# Patient Record
Sex: Female | Born: 1950
Health system: Southern US, Community
[De-identification: ages and names within clinical notes are randomized; demographics above are authoritative.]

## PROBLEM LIST (undated history)

## (undated) DIAGNOSIS — E119 Type 2 diabetes mellitus without complications: Secondary | ICD-10-CM

## (undated) DIAGNOSIS — I10 Essential (primary) hypertension: Secondary | ICD-10-CM

## (undated) DIAGNOSIS — K922 Gastrointestinal hemorrhage, unspecified: Secondary | ICD-10-CM

## (undated) HISTORY — PX: NO PAST SURGERIES: SHX2092

## (undated) HISTORY — DX: Essential (primary) hypertension: I10

## (undated) HISTORY — PX: BUNIONECTOMY: SHX129

## (undated) HISTORY — PX: UNILATERAL SALPINGECTOMY: SHX6160
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
## 2019-01-23 ENCOUNTER — Encounter: Payer: Self-pay | Admitting: Emergency Medicine

## 2019-01-23 ENCOUNTER — Other Ambulatory Visit: Payer: Self-pay

## 2019-01-23 ENCOUNTER — Inpatient Hospital Stay
Admission: EM | Admit: 2019-01-23 | Discharge: 2019-01-24 | DRG: 378 | Disposition: A | Discharge status: Home / Self Care | Payer: Medicare Other | Attending: Internal Medicine | Admitting: Internal Medicine

## 2019-01-23 DIAGNOSIS — K922 Gastrointestinal hemorrhage, unspecified: Secondary | ICD-10-CM | POA: Diagnosis present

## 2019-01-23 DIAGNOSIS — E119 Type 2 diabetes mellitus without complications: Secondary | ICD-10-CM | POA: Diagnosis present

## 2019-01-23 DIAGNOSIS — I10 Essential (primary) hypertension: Secondary | ICD-10-CM | POA: Diagnosis present

## 2019-01-23 DIAGNOSIS — D649 Anemia, unspecified: Secondary | ICD-10-CM

## 2019-01-23 DIAGNOSIS — R300 Dysuria: Secondary | ICD-10-CM | POA: Diagnosis present

## 2019-01-23 DIAGNOSIS — D62 Acute posthemorrhagic anemia: Secondary | ICD-10-CM | POA: Diagnosis present

## 2019-01-23 HISTORY — DX: Type 2 diabetes mellitus without complications: E11.9

## 2019-01-23 LAB — HEMOGLOBIN: Hemoglobin: 10.3 g/dL — ABNORMAL LOW (ref 12.0–15.0)

## 2019-01-23 LAB — CBC
HCT: 32.4 % — ABNORMAL LOW (ref 36.0–46.0)
HEMOGLOBIN: 10.1 g/dL — AB (ref 12.0–15.0)
MCH: 27 pg (ref 26.0–34.0)
MCHC: 31.2 g/dL (ref 30.0–36.0)
MCV: 86.6 fL (ref 80.0–100.0)
Platelets: 249 10*3/uL (ref 150–400)
RBC: 3.74 MIL/uL — ABNORMAL LOW (ref 3.87–5.11)
RDW: 12.8 % (ref 11.5–15.5)
WBC: 6.4 10*3/uL (ref 4.0–10.5)
nRBC: 0 % (ref 0.0–0.2)

## 2019-01-23 LAB — COMPREHENSIVE METABOLIC PANEL
ALT: 13 U/L (ref 0–44)
AST: 16 U/L (ref 15–41)
Albumin: 3.8 g/dL (ref 3.5–5.0)
Alkaline Phosphatase: 80 U/L (ref 38–126)
Anion gap: 6 (ref 5–15)
BUN: 9 mg/dL (ref 8–23)
CO2: 24 mmol/L (ref 22–32)
Calcium: 8.7 mg/dL — ABNORMAL LOW (ref 8.9–10.3)
Chloride: 105 mmol/L (ref 98–111)
Creatinine, Ser: 0.93 mg/dL (ref 0.44–1.00)
GFR calc Af Amer: >60 mL/min (ref >60)
GFR calc non Af Amer: >60 mL/min (ref >60)
Glucose, Bld: 369 mg/dL — ABNORMAL HIGH (ref 70–99)
Potassium: 3.7 mmol/L (ref 3.5–5.1)
Sodium: 135 mmol/L (ref 135–145)
Total Bilirubin: 0.5 mg/dL (ref 0.3–1.2)
Total Protein: 6.8 g/dL (ref 6.5–8.1)

## 2019-01-23 LAB — TYPE AND SCREEN
ABO/RH(D): B POS
ANTIBODY SCREEN: NEGATIVE

## 2019-01-23 MED ORDER — INSULIN ASPART 100 UNIT/ML ~~LOC~~ SOLN
0.0000 [IU] | Freq: Four times a day (QID) | SUBCUTANEOUS | Status: DC
  Administered 2019-01-24: 5 [IU] via SUBCUTANEOUS
  Administered 2019-01-24 (×2): 2 [IU] via SUBCUTANEOUS
  Filled 2019-01-23 (×3): qty 1

## 2019-01-23 MED ORDER — ONDANSETRON HCL 4 MG PO TABS: 4.0000 mg | ORAL_TABLET | Freq: Four times a day (QID) | ORAL | Status: DC | PRN

## 2019-01-23 MED ORDER — ACETAMINOPHEN 650 MG RE SUPP: 650.0000 mg | Freq: Four times a day (QID) | RECTAL | Status: DC | PRN

## 2019-01-23 MED ORDER — HYDRALAZINE HCL 20 MG/ML IJ SOLN
INTRAMUSCULAR | Status: AC
  Administered 2019-01-23: 10 mg via INTRAVENOUS
  Filled 2019-01-23: qty 1

## 2019-01-23 MED ORDER — ACETAMINOPHEN 325 MG PO TABS: 650.0000 mg | ORAL_TABLET | Freq: Four times a day (QID) | ORAL | Status: DC | PRN

## 2019-01-23 MED ORDER — ONDANSETRON HCL 4 MG/2ML IJ SOLN: 4.0000 mg | Freq: Four times a day (QID) | INTRAMUSCULAR | Status: DC | PRN

## 2019-01-23 MED ORDER — HYDRALAZINE HCL 20 MG/ML IJ SOLN
10.0000 mg | INTRAMUSCULAR | Status: DC | PRN
  Administered 2019-01-23: 10 mg via INTRAVENOUS

## 2019-01-23 NOTE — ED Provider Notes (Signed)
[facility] Emergency Department Provider Note  ____________________________________________   First MD Initiated Contact with Patient 01/23/19 2030     (approximate)  I have reviewed the triage vital signs and the nursing notes.   HISTORY  Chief Complaint GI Bleeding   HPI Jessica [name] is a 68 y.o. female with a history of diabetes as well as past history of GI bleeding who is presented to the emergency department GI bleeding on and off over the past 3 to 4 days.  He says that her stool can be maroon to black.  Says that she has become weak as of today without any focal weakness.  States that she is not having any pain with urination or any abdominal pain.  States that she has no history of anemia.  However, she does state that she had a blood transfusion when she was 68 years old due to heavy menstrual periods.  Says that she has had GI bleeding in the past but is never received a diagnosis.  Denies taking any blood thinners.   Past Medical History:  Diagnosis Date  . Diabetes mellitus without complication ([place])     There are no active problems to display for this patient.   History reviewed. No pertinent surgical history.  Prior to Admission medications   Not on File    Allergies Patient has no known allergies.  No family history on file.  Social History Social History   Tobacco Use  . Smoking status: Never Smoker  . Smokeless tobacco: Never Used  Substance Use Topics  . Alcohol use: Not Currently  . Drug use: Never    Review of Systems  Constitutional: No fever/chills Eyes: No visual changes. ENT: No sore throat. Cardiovascular: Denies chest pain. Respiratory: Denies shortness of breath. Gastrointestinal: No abdominal pain.  No nausea, no vomiting.  No diarrhea.  No constipation. Genitourinary: Negative for dysuria. Musculoskeletal: Negative for back pain. Skin: Negative for rash. Neurological: Negative for headaches, focal  weakness or numbness.   ____________________________________________   PHYSICAL EXAM:  VITAL SIGNS: ED Triage Vitals [01/23/19 1724]  Enc Vitals Group     BP (!) 153/89     Pulse Rate 88     Resp 16     Temp 98.4 F (36.9 C)     Temp Source Oral     SpO2 99 %     Weight      Height      Head Circumference      Peak Flow      Pain Score 0     Pain Loc      Pain Edu?      Excl. in [place]?     Constitutional: Alert and oriented. Well appearing and in no acute distress. Eyes: Conjunctivae are normal.  Head: Atraumatic. Nose: No congestion/rhinnorhea. Mouth/Throat: Mucous membranes are moist.  Neck: No stridor.   Cardiovascular: Normal rate, regular rhythm. Grossly normal heart sounds.   Respiratory: Normal respiratory effort.  No retractions. Lungs CTAB. Gastrointestinal: Soft and nontender. No distention.  Digital rectal exam with hematochezia. Musculoskeletal: No lower extremity tenderness nor edema.  No joint effusions. Neurologic:  Normal speech and language. No gross focal neurologic deficits are appreciated. Skin:  Skin is warm, dry and intact. No rash noted. Psychiatric: Mood and affect are normal. Speech and behavior are normal.  ____________________________________________   LABS (all labs ordered are listed, but only abnormal results are displayed)  Labs Reviewed  COMPREHENSIVE METABOLIC PANEL - Abnormal; Notable  for the following components:      Result Value   Glucose, Bld 369 (*)    Calcium 8.7 (*)    All other components within normal limits  CBC - Abnormal; Notable for the following components:   RBC 3.74 (*)    Hemoglobin 10.1 (*)    HCT 32.4 (*)    All other components within normal limits  POC OCCULT BLOOD, ED  TYPE AND SCREEN   ____________________________________________  EKG   ____________________________________________  RADIOLOGY   ____________________________________________   PROCEDURES  Procedure(s) performed:    Procedures  Critical Care performed:   ____________________________________________   INITIAL IMPRESSION / ASSESSMENT AND PLAN / ED COURSE  Pertinent labs & imaging results that were available during my care of the patient were reviewed by me and considered in my medical decision making (see chart for details).  DDX: Diverticular bleed, internal hemorrhoid, anemia, symptomatic anemia, electrolyte abnormality As part of my medical decision making, I reviewed the following data within the [facility] previous files on record for review  ----------------------------------------- 9:24 PM on 01/23/2019 -----------------------------------------  Patient at this time is stable vital signs spoke with symptomatic anemia from GI bleeding.  Patient agrees to be admitted to the hospital.  Signed out to Dr. Jannifer Franklin.  Not at the point now we would transfuse.  ____________________________________________   FINAL CLINICAL IMPRESSION(S) / ED DIAGNOSES  Symptomatic anemia.  GI bleeding.  NEW MEDICATIONS STARTED DURING THIS VISIT:  New Prescriptions   No medications on file     Note:  This document was prepared using Dragon voice recognition software and may include unintentional dictation errors.     Orbie Pyo, MD 01/23/19 2125

## 2019-01-23 NOTE — H&P (Addendum)
[facility] NAME: Jessica [name]    MR#:  [id number]  DATE OF BIRTH:  April 15, 1951  DATE OF ADMISSION:  01/23/2019  PRIMARY CARE PHYSICIAN: System, Pcp Not In   REQUESTING/REFERRING PHYSICIAN: Clearnce Hasten, MD  CHIEF COMPLAINT:   Chief Complaint  Patient presents with  . GI Bleeding    HISTORY OF PRESENT ILLNESS:  Jessica [name]  is a 68 y.o. female who presents with chief complaint as above.  Patient presents with several days of recurrent bleeding per rectum.  She states that this started about 4 days ago, and was worse initially.  Has been improving some since then.  However, given the fact that it was not stopping she came to the ED for evaluation.  She was found to have hematochezia here.  Her hemoglobin is stable around 10, including on repeat check.  We do not have prior records of what her baseline hemoglobin is.  Hospitalist were called for admission and further evaluation  Of note, patient adamantly states she has a home closing scheduled for Friday and needs to be discharged prior to this in order to finish preparations for the same.  PAST MEDICAL HISTORY:   Past Medical History:  Diagnosis Date  . Diabetes mellitus without complication ([place])      PAST SURGICAL HISTORY:   Past Surgical History:  Procedure Laterality Date  . NO PAST SURGERIES       SOCIAL HISTORY:   Social History   Tobacco Use  . Smoking status: Never Smoker  . Smokeless tobacco: Never Used  Substance Use Topics  . Alcohol use: Not Currently     FAMILY HISTORY:    Family history reviewed and is non-contributory DRUG ALLERGIES:  No Known Allergies  MEDICATIONS AT HOME:   Prior to Admission medications   Not on File    REVIEW OF SYSTEMS:  Review of Systems  Constitutional: Positive for malaise/fatigue. Negative for chills, fever and weight loss.  HENT: Negative for ear pain, hearing loss and tinnitus.   Eyes: Negative for  blurred vision, double vision, pain and redness.  Respiratory: Negative for cough, hemoptysis and shortness of breath.   Cardiovascular: Negative for chest pain, palpitations, orthopnea and leg swelling.  Gastrointestinal: Positive for blood in stool. Negative for abdominal pain, constipation, diarrhea, nausea and vomiting.  Genitourinary: Negative for dysuria, frequency and hematuria.  Musculoskeletal: Negative for back pain, joint pain and neck pain.  Skin:       No acne, rash, or lesions  Neurological: Negative for dizziness, tremors, focal weakness and weakness.  Endo/Heme/Allergies: Negative for polydipsia. Does not bruise/bleed easily.  Psychiatric/Behavioral: Negative for depression. The patient is not nervous/anxious and does not have insomnia.      VITAL SIGNS:   Vitals:   01/23/19 1724  BP: (!) 153/89  Pulse: 88  Resp: 16  Temp: 98.4 F (36.9 C)  TempSrc: Oral  SpO2: 99%   Wt Readings from Last 3 Encounters:  No data found for Wt    PHYSICAL EXAMINATION:  Physical Exam  Vitals reviewed. Constitutional: She is oriented to person, place, and time. She appears well-developed and well-nourished. No distress.  HENT:  Head: Normocephalic and atraumatic.  Mouth/Throat: Oropharynx is clear and moist.  Eyes: Pupils are equal, round, and reactive to light. Conjunctivae and EOM are normal. No scleral icterus.  Neck: Normal range of motion. Neck supple. No JVD present. No thyromegaly present.  Cardiovascular: Normal rate, regular rhythm and intact  distal pulses. Exam reveals no gallop and no friction rub.  No murmur heard. Respiratory: Effort normal and breath sounds normal. No respiratory distress. She has no wheezes. She has no rales.  GI: Soft. Bowel sounds are normal. She exhibits no distension. There is no abdominal tenderness.  Musculoskeletal: Normal range of motion.        General: No edema.     Comments: No arthritis, no gout  Lymphadenopathy:    She has no  cervical adenopathy.  Neurological: She is alert and oriented to person, place, and time. No cranial nerve deficit.  No dysarthria, no aphasia  Skin: Skin is warm and dry. No rash noted. No erythema.  Psychiatric: She has a normal mood and affect. Her behavior is normal. Judgment and thought content normal.    LABORATORY PANEL:   CBC Recent Labs  Lab 01/23/19 1727  WBC 6.4  HGB 10.1*  HCT 32.4*  PLT 249   ------------------------------------------------------------------------------------------------------------------  Chemistries  Recent Labs  Lab 01/23/19 1727  NA 135  K 3.7  CL 105  CO2 24  GLUCOSE 369*  BUN 9  CREATININE 0.93  CALCIUM 8.7*  AST 16  ALT 13  ALKPHOS 80  BILITOT 0.5   ------------------------------------------------------------------------------------------------------------------  Cardiac Enzymes No results for input(s): TROPONINI in the last 168 hours. ------------------------------------------------------------------------------------------------------------------  RADIOLOGY:  No results found.  EKG:  No orders found for this or any previous visit.  IMPRESSION AND PLAN:  Principal Problem:   GI bleed -keep n.p.o., check serial hemoglobin, GI consult Active Problems:   Diabetes (HCC) -sliding scale insulin coverage  Chart review performed and case discussed with ED provider. Labs, imaging and/or ECG reviewed by provider and discussed with patient/family. Management plans discussed with the patient and/or family.  DVT PROPHYLAXIS: Mechanical only  GI PROPHYLAXIS:  None  ADMISSION STATUS: Inpatient     CODE STATUS: Full  TOTAL TIME TAKING CARE OF THIS PATIENT: 45 minutes.   Ethlyn Daniels 01/23/2019, 9:58 PM  [facility]  Office  [phone]  CC: Primary care physician; System, Pcp Not In  Note:  This document was prepared using Dragon voice recognition software and may include unintentional dictation  errors.

## 2019-01-23 NOTE — ED Triage Notes (Signed)
PT c/o bloody, dark stool x4days. Pt states bleeding is getting better. Pt states increased weakness. VSS Denies any anticoag use

## 2019-01-24 LAB — BASIC METABOLIC PANEL
ANION GAP: 4 — AB (ref 5–15)
BUN: 9 mg/dL (ref 8–23)
CO2: 24 mmol/L (ref 22–32)
Calcium: 9.1 mg/dL (ref 8.9–10.3)
Chloride: 112 mmol/L — ABNORMAL HIGH (ref 98–111)
Creatinine, Ser: 0.75 mg/dL (ref 0.44–1.00)
GFR calc Af Amer: >60 mL/min (ref >60)
GFR calc non Af Amer: >60 mL/min (ref >60)
Glucose, Bld: 205 mg/dL — ABNORMAL HIGH (ref 70–99)
Potassium: 3.9 mmol/L (ref 3.5–5.1)
Sodium: 140 mmol/L (ref 135–145)

## 2019-01-24 LAB — CBC
HCT: 30.9 % — ABNORMAL LOW (ref 36.0–46.0)
Hemoglobin: 9.9 g/dL — ABNORMAL LOW (ref 12.0–15.0)
MCH: 27 pg (ref 26.0–34.0)
MCHC: 32 g/dL (ref 30.0–36.0)
MCV: 84.4 fL (ref 80.0–100.0)
Platelets: 248 10*3/uL (ref 150–400)
RBC: 3.66 MIL/uL — ABNORMAL LOW (ref 3.87–5.11)
RDW: 12.8 % (ref 11.5–15.5)
WBC: 6.2 10*3/uL (ref 4.0–10.5)
nRBC: 0 % (ref 0.0–0.2)

## 2019-01-24 LAB — GLUCOSE, CAPILLARY
GLUCOSE-CAPILLARY: 189 mg/dL — AB (ref 70–99)
Glucose-Capillary: 174 mg/dL — ABNORMAL HIGH (ref 70–99)
Glucose-Capillary: 296 mg/dL — ABNORMAL HIGH (ref 70–99)

## 2019-01-24 MED ORDER — HYDRALAZINE HCL 25 MG PO TABS: 25.0000 mg | ORAL_TABLET | Freq: Three times a day (TID) | ORAL | 0 refills | Status: DC

## 2019-01-24 MED ORDER — NYSTATIN-TRIAMCINOLONE 100000-0.1 UNIT/GM-% EX OINT: 1.0000 "application " | TOPICAL_OINTMENT | Freq: Two times a day (BID) | CUTANEOUS | 0 refills | Status: DC

## 2019-01-24 NOTE — Progress Notes (Signed)
[facility] [place] NAME: Jessica [name]    MR#:  [id number]  DATE OF BIRTH:  10/11/1951  SUBJECTIVE: Patient admitted for rectal bleed, patient told me that she has bleeding from stool for last 3 days and also has dysuria.  More tremulous recently, in the process of buying a home [facility] has appointment with attorney today at 4 PM for house closing.  Patient denies abdominal pain,   CHIEF COMPLAINT:   Chief Complaint  Patient presents with  . GI Bleeding    REVIEW OF SYSTEMS:   ROS CONSTITUTIONAL: No fever, fatigue or weakness.  EYES: No blurred or double vision.  EARS, NOSE, AND THROAT: No tinnitus or ear pain.  RESPIRATORY: No cough, shortness of breath, wheezing or hemoptysis.  CARDIOVASCULAR: No chest pain, orthopnea, edema.  GASTROINTESTINAL: No nausea, vomiting, diarrhea or abdominal pain.  GENITOURINARY: Complains of dysuria but no hematuria ENDOCRINE: No polyuria, nocturia,  HEMATOLOGY: No anemia, easy bruising or bleeding SKIN: No rash or lesion. MUSCULOSKELETAL: No joint pain or arthritis.   NEUROLOGIC: No tingling, numbness, weakness.  PSYCHIATRY: No anxiety or depression.   DRUG ALLERGIES:  No Known Allergies  VITALS:  Blood pressure (!) 142/72, pulse 94, temperature 98.8 F (37.1 C), temperature source Oral, resp. rate 20, height 5\' 9"  (1.753 m), weight 87.1 kg, SpO2 100 %.  PHYSICAL EXAMINATION:  GENERAL:  68 y.o.-year-old patient lying in the bed with no acute distress.  EYES: Pupils equal, round, reactive to light and accommodation. No scleral icterus. Extraocular muscles intact.  HEENT: Head atraumatic, normocephalic. Oropharynx and nasopharynx clear.  NECK:  Supple, no jugular venous distention. No thyroid enlargement, no tenderness.  LUNGS: Normal breath sounds bilaterally, no wheezing, rales,rhonchi or crepitation. No use of accessory muscles of respiration.  CARDIOVASCULAR: S1, S2 normal. No murmurs,  rubs, or gallops.  ABDOMEN: Soft, nontender, nondistended. Bowel sounds present. No organomegaly or mass.  EXTREMITIES: No pedal edema, cyanosis, or clubbing.  NEUROLOGIC: Cranial nerves II through XII are intact. Muscle strength 5/5 in all extremities. Sensation intact. Gait not checked.  PSYCHIATRIC: The patient is alert and oriented x 3.  SKIN: No obvious rash, lesion, or ulcer.    LABORATORY PANEL:   CBC Recent Labs  Lab 01/24/19 0432  WBC 6.2  HGB 9.9*  HCT 30.9*  PLT 248   ------------------------------------------------------------------------------------------------------------------  Chemistries  Recent Labs  Lab 01/23/19 1727 01/24/19 0432  NA 135 140  K 3.7 3.9  CL 105 112*  CO2 24 24  GLUCOSE 369* 205*  BUN 9 9  CREATININE 0.93 0.75  CALCIUM 8.7* 9.1  AST 16  --   ALT 13  --   ALKPHOS 80  --   BILITOT 0.5  --    ------------------------------------------------------------------------------------------------------------------  Cardiac Enzymes No results for input(s): TROPONINI in the last 168 hours. ------------------------------------------------------------------------------------------------------------------  RADIOLOGY:  No results found.  EKG:  No orders found for this or any previous visit.  ASSESSMENT AND PLAN:   68 year old female patient with history of diabetes mellitus type 2, who recently moved from [place] and has no PCP here comes in with rectal bleeding going on for last 3 days, also has dysuria. 1.  Rectal bleed likely lower GI, hemoglobin stable, hemoglobin was 10.1 yesterday and 9.9 today.  Spoke with Dr. Alice Reichert gastroenterology on call, recommended clear liquid diet, possible EGD, colonoscopy on this admission, same explained to the patient, patient does not seem to be worried about rectal bleeding anymore-she  is more focused on had appointment with her attorney for house closure, recently bought a house in [place], moved from  [place].   #2. dysuria, check UA, start antibiotics if there is infection.  Patient thinks she has yeast infection.  Give 1 dose of Diflucan. Diabetes mellitus type 2: Continue sliding scale insulin with coverage by the hospital.   All the records are reviewed and case discussed with Care Management/Social Workerr. Management plans discussed with the patient, family and they are in agreement.  CODE STATUS: Full code  TOTAL TIME TAKING CARE OF THIS PATIENT: 35 minutes.   Possible discharge tomorrow.Epifanio Lesches M.D on 01/24/2019 at 11:34 AM  Between 7am to 6pm - Pager - [phone]  After 6pm go to [email or web address] - password [email or web address] [facility]  Office  [phone]  CC: Primary care physician; System, Pcp Not In   Note: This dictation was prepared with Dragon dictation along with smaller phrase technology. Any transcriptional errors that result from this process are unintentional.

## 2019-01-24 NOTE — Consult Note (Signed)
GI Inpatient Consult Note  Reason for Consult: Rectal bleeding, symptomatic anemia   Attending Requesting Consult: Dr. Vianne Bulls  History of Present Illness: Jessica [name] is a 68 y.o. female seen for evaluation of rectal bleeding at the request of Dr. Vianne Bulls. PMH is significant for diabetes mellitus. Pt presented to the ED last night for rectal bleeding for the past four days which seemed to be improving somewhat but was concerned because the bleeding persisted. She was found to have hematochezia on digital exam in the ED and hemoglobin 10.1. No baseline hemoglobin to compare to. Hemoglobin today 9.9.  Patient seen and examined this afternoon resting comfortably in bed. She reports she has had BM this morning which was not bloody. Her main complaint today is dysuria w/ associated urgency and burning when urinating. She denies hematuria. She denies abdominal pain or cramping preceding bowel movements. She denies dark, tarry stools. She was feeling very tired yesterday, but feels better today. She reports a previously colonoscopy in [place] appx 3-4 years ago which was reportedly negative. She denies any known family hx of colon cancer or adenomatous polyps. No unintentional weight loss. She is very concerned about her upcoming appt to close on her new house in [place]. She is moving here from [place]. She denies frequent NSAID use.    Last Colonoscopy: 4 years ago?  Report not available  Last Endoscopy:    Past Medical History:  Past Medical History:  Diagnosis Date  . Diabetes mellitus without complication [facility])     Problem List: Patient Active Problem List   Diagnosis Date Noted  . GI bleed 01/23/2019  . Diabetes ([place]) 01/23/2019    Past Surgical History: Past Surgical History:  Procedure Laterality Date  . NO PAST SURGERIES      Allergies: No Known Allergies  Home Medications: No medications prior to admission.   Home medication reconciliation was completed with the  patient.   Scheduled Inpatient Medications:   . insulin aspart  0-9 Units Subcutaneous Q6H    Continuous Inpatient Infusions:    PRN Inpatient Medications:  acetaminophen **OR** acetaminophen, hydrALAZINE, ondansetron **OR** ondansetron (ZOFRAN) IV  Family History: family history is not on file.  The patient's family history is negative for inflammatory bowel disorders, GI malignancy, or solid organ transplantation.  Social History:   reports that she has never smoked. She has never used smokeless tobacco. She reports previous alcohol use. She reports that she does not use drugs. The patient denies ETOH, tobacco, or drug use.   Review of Systems: Constitutional: Weight is stable.  Eyes: No changes in vision. ENT: No oral lesions, sore throat.  GI: see HPI.  Heme/Lymph: No easy bruising.  CV: No chest pain.  GU: No hematuria.  Integumentary: No rashes.  Neuro: No headaches.  Psych: No depression/anxiety.  Endocrine: No heat/cold intolerance.  Allergic/Immunologic: No urticaria.  Resp: No cough, SOB.  Musculoskeletal: No joint swelling.    Physical Examination: BP (!) 142/72   Pulse 94   Temp 98.8 F (37.1 C) (Oral)   Resp 20   Ht 5\' 9"  (1.753 m)   Wt 87.1 kg   SpO2 100%   BMI 28.35 kg/m  Gen: NAD, alert and oriented x 4 HEENT: PEERLA, EOMI, Neck: supple, no JVD or thyromegaly Chest: CTA bilaterally, no wheezes, crackles, or other adventitious sounds CV: RRR, no m/g/c/r Abd: soft, NT, ND, +BS in all four quadrants; no HSM, guarding, ridigity, or rebound tenderness Ext: no edema, well perfused with 2+  pulses, Skin: no rash or lesions noted Lymph: no LAD  Data: Lab Results  Component Value Date   WBC 6.2 01/24/2019   HGB 9.9 (L) 01/24/2019   HCT 30.9 (L) 01/24/2019   MCV 84.4 01/24/2019   PLT 248 01/24/2019   Recent Labs  Lab 01/23/19 1727 01/23/19 2320 01/24/19 0432  HGB 10.1* 10.3* 9.9*   Lab Results  Component Value Date   NA 140 01/24/2019    K 3.9 01/24/2019   CL 112 (H) 01/24/2019   CO2 24 01/24/2019   BUN 9 01/24/2019   CREATININE 0.75 01/24/2019   Lab Results  Component Value Date   ALT 13 01/23/2019   AST 16 01/23/2019   ALKPHOS 80 01/23/2019   BILITOT 0.5 01/23/2019   No results for input(s): APTT, INR, PTT in the last 168 hours. Assessment/Plan:  68 y/o AA female with a PMH of diabetes admitted for rectal bleeding  1. Rectal bleeding 2. Symptomatic anemia   - Pt has noticed bright red blood in her stools for the past 4 days. BM today w/o any hematochezia or melena. She does have symptomatic anemia but she is currently hemodynamically stable. No baseline hemoglobin to compare to. - Differential includes anal outlet etiology such as internal hemorrhoids, diverticular, polyp, malignancy, AVM - Recommend luminal evaluation with colonoscopy for definitive evaluation. We had extensive conversation today about the procedure details and indications. She would like hold off at this time due to her upcoming closing on her house. She would like to f/u in the office to schedule procedures. We discussed should she have any recurrent bleeding or other symptoms like nausea, vomiting, shortness of breath, or dizziness, she should return to ED immediately. - She is advised to call our office to schedule a f/u appt with myself or Dr. Alice Reichert   Thank you for the consult. Please call with questions or concerns.  Geanie Kenning, PA-C [facility]  [phone]

## 2019-01-24 NOTE — Progress Notes (Signed)
Inpatient Diabetes Program Recommendations  AACE/ADA: New Consensus Statement on Inpatient Glycemic Control (2015)  Target Ranges:  Prepandial:   less than 140 mg/dL      Peak postprandial:   less than 180 mg/dL (1-2 hours)      Critically ill patients:  140 - 180 mg/dL   Lab Results  Component Value Date   GLUCAP 174 (H) 01/24/2019    Review of Glycemic ControlResults for Jessica, [name] (MRN [id number]) as of 01/24/2019 12:52  Ref. Range 01/24/2019 00:00 01/24/2019 06:12 01/24/2019 11:51  Glucose-Capillary Latest Ref Range: 70 - 99 mg/dL 296 (H) 189 (H) 174 (H)    Diabetes history: DM Outpatient Diabetes medications: None Current orders for Inpatient glycemic control:  Novolog sensitive q 6 hours  Inpatient Diabetes Program Recommendations:   Blood sugars improved however >300 mg/dL initially. No home DM medications listed. Will need to f/u with PCP regarding elevated blood sugars.    Thanks,  Adah Perl, RN, BC-ADM Inpatient Diabetes Coordinator Pager [phone] (8a-5p)

## 2019-01-24 NOTE — Progress Notes (Signed)
Order received from Dr Vianne Bulls to discharge the patient.  IV removed.  Patient being sent out via wheechair to her car and is driving herself home

## 2019-01-25 LAB — HIV ANTIBODY (ROUTINE TESTING W REFLEX): HIV Screen 4th Generation wRfx: NONREACTIVE

## 2019-01-29 NOTE — Discharge Summary (Signed)
Jessica [name], is a 68 y.o. female  DOB [birth year]  MRN [id number].  Admission date:  01/23/2019  Admitting Physician  Lance Coon, MD  Discharge Date:  01/24/2019   Primary MD  System, Pcp Not In  Recommendations for primary care physician for things to follow:      Admission Diagnosis  Symptomatic anemia [D64.9] Gastrointestinal hemorrhage, unspecified gastrointestinal hemorrhage type [K92.2]   Discharge Diagnosis  Symptomatic anemia [D64.9] Gastrointestinal hemorrhage, unspecified gastrointestinal hemorrhage type [K92.2]    Principal Problem:   GI bleed Active Problems:   Diabetes [facility])      Past Medical History:  Diagnosis Date  . Diabetes mellitus without complication [facility])     Past Surgical History:  Procedure Laterality Date  . NO PAST SURGERIES         History of present illness and  Hospital Course:     Kindly see H&P for history of present illness and admission details, please review complete Labs, Consult reports and Test reports for all details in brief  HPI  from the history and physical done on the day of admission  68 year old female patient with history of diabetes mellitus type 2 comes in for follow-up blood from stool and admitted for possible GI bleed.  Hospital Course  #1 rectal bleed: Admitted to medical service for this, monitor hemoglobin, started on IV fluids, n.p.o., no gastroenterology.  Noticed blood in the stool for 4 days.  Patient remained hemodynamically stable with stable hemoglobin Of 10.1 and 10.3.  Seen by gastroenterology, offered colonoscopy but she refused, patient did not have any further hematochezia or melena.  she wanted to go home recently moved from [place] and in the process of buying a home in [place].  Advised to follow-up in the office, also advised  her to come back to emergency room.  Experiences recurrent bleeding, nausea, vomiting, shortness of breath. #2 diabetes mellitus type 2: Patient is on Trulicity, metformin, NovoLog with meals.  Advised to continue, advised her to follow have a new PCP here. #3 dysuria: Requested antifungal medicine for possible vaginitis due to Candida, patient tested she feels itchy and she thinks it is yeast infection.  Give prescription for nystatin ointment. 4.  Essential hypertension-new, received hydralazine prescription.  Discharge Condition: stable   Follow UP      Discharge Instructions  and  Discharge Medications     Allergies as of 01/24/2019   No Known Allergies     Medication List    TAKE these medications   hydrALAZINE 25 MG tablet Commonly known as:  APRESOLINE Take 1 tablet (25 mg total) by mouth 3 (three) times daily.   insulin aspart 100 UNIT/ML injection Commonly known as:  novoLOG Inject 8 Units into the skin 3 (three) times daily before meals.   metFORMIN 500 MG tablet Commonly known as:  GLUCOPHAGE Take 1,000 mg by mouth 2 (two) times daily with a meal.   nystatin-triamcinolone ointment Commonly known as:  MYCOLOG Apply 1 application topically 2 (two) times daily. For vaginal candidiasis   TRULICITY 1.5 QQ/7.6PP Sopn Generic drug:  Dulaglutide Inject 1 pen into the skin every 7 (seven) days.         Diet and Activity recommendation: See Discharge Instructions above   Consults obtained - Gastroeneterologist   Major procedures and Radiology Reports - PLEASE review detailed and final reports for all details, in brief -       No results found.  Micro Results    No  results found for this or any previous visit (from the past 240 hour(s)).     Today   Subjective:   Dangela How today has no headache,no chest abdominal pain,no new weakness tingling or numbness, feels much better wants to go home today.   Objective:   Blood pressure (!) 147/81,  pulse 90, temperature 99.1 F (37.3 C), temperature source Oral, resp. rate 16, height 5\' 9"  (1.753 m), weight 87.1 kg, SpO2 97 %.  No intake or output data in the 24 hours ending 01/29/19 0954  Exam Awake Alert, Oriented x 3, No new F.N deficits, Normal affect [place].AT,PERRAL Supple Neck,No JVD, No cervical lymphadenopathy appriciated.  Symmetrical Chest wall movement, Good air movement bilaterally, CTAB RRR,No Gallops,Rubs or new Murmurs, No Parasternal Heave +ve B.Sounds, Abd Soft, Non tender, No organomegaly appriciated, No rebound -guarding or rigidity. No Cyanosis, Clubbing or edema, No new Rash or bruise  Data Review   CBC w Diff:  Lab Results  Component Value Date   WBC 6.2 01/24/2019   HGB 9.9 (L) 01/24/2019   HCT 30.9 (L) 01/24/2019   PLT 248 01/24/2019    CMP:  Lab Results  Component Value Date   NA 140 01/24/2019   K 3.9 01/24/2019   CL 112 (H) 01/24/2019   CO2 24 01/24/2019   BUN 9 01/24/2019   CREATININE 0.75 01/24/2019   PROT 6.8 01/23/2019   ALBUMIN 3.8 01/23/2019   BILITOT 0.5 01/23/2019   ALKPHOS 80 01/23/2019   AST 16 01/23/2019   ALT 13 01/23/2019  .   Total Time in preparing paper work, data evaluation and todays exam - 35 minutes  Epifanio Lesches M.D on 2/052020 at 9:54 AM    Note: This dictation was prepared with Dragon dictation along with smaller phrase technology. Any transcriptional errors that result from this process are unintentional.

## 2019-02-06 ENCOUNTER — Ambulatory Visit: Payer: Self-pay | Admitting: Family Medicine

## 2019-08-08 ENCOUNTER — Emergency Department
Admission: EM | Admit: 2019-08-08 | Discharge: 2019-08-08 | Disposition: A | Discharge status: Home / Self Care | Payer: Medicare Other | Attending: Emergency Medicine | Admitting: Emergency Medicine

## 2019-08-08 ENCOUNTER — Emergency Department: Payer: Medicare Other

## 2019-08-08 ENCOUNTER — Other Ambulatory Visit: Payer: Self-pay

## 2019-08-08 DIAGNOSIS — R0600 Dyspnea, unspecified: Secondary | ICD-10-CM | POA: Insufficient documentation

## 2019-08-08 DIAGNOSIS — E1165 Type 2 diabetes mellitus with hyperglycemia: Secondary | ICD-10-CM | POA: Diagnosis not present

## 2019-08-08 DIAGNOSIS — R0602 Shortness of breath: Secondary | ICD-10-CM | POA: Diagnosis present

## 2019-08-08 DIAGNOSIS — R739 Hyperglycemia, unspecified: Secondary | ICD-10-CM

## 2019-08-08 LAB — CBC WITH DIFFERENTIAL/PLATELET
Abs Immature Granulocytes: 0.01 10*3/uL (ref 0.00–0.07)
Basophils Absolute: 0 10*3/uL (ref 0.0–0.1)
Basophils Relative: 1 %
Eosinophils Absolute: 0.2 10*3/uL (ref 0.0–0.5)
Eosinophils Relative: 3 %
HCT: 39.4 % (ref 36.0–46.0)
Hemoglobin: 12.7 g/dL (ref 12.0–15.0)
Immature Granulocytes: 0 %
Lymphocytes Relative: 40 %
Lymphs Abs: 2.1 10*3/uL (ref 0.7–4.0)
MCH: 26.3 pg (ref 26.0–34.0)
MCHC: 32.2 g/dL (ref 30.0–36.0)
MCV: 81.7 fL (ref 80.0–100.0)
Monocytes Absolute: 0.4 10*3/uL (ref 0.1–1.0)
Monocytes Relative: 7 %
Neutro Abs: 2.6 10*3/uL (ref 1.7–7.7)
Neutrophils Relative %: 49 %
Platelets: 270 10*3/uL (ref 150–400)
RBC: 4.82 MIL/uL (ref 3.87–5.11)
RDW: 13.2 % (ref 11.5–15.5)
WBC: 5.3 10*3/uL (ref 4.0–10.5)
nRBC: 0 % (ref 0.0–0.2)

## 2019-08-08 LAB — BASIC METABOLIC PANEL
Anion gap: 11 (ref 5–15)
BUN: 10 mg/dL (ref 8–23)
CO2: 23 mmol/L (ref 22–32)
Calcium: 9.6 mg/dL (ref 8.9–10.3)
Chloride: 98 mmol/L (ref 98–111)
Creatinine, Ser: 0.99 mg/dL (ref 0.44–1.00)
GFR calc Af Amer: >60 mL/min (ref >60)
GFR calc non Af Amer: 59 mL/min — ABNORMAL LOW (ref >60)
Glucose, Bld: 459 mg/dL — ABNORMAL HIGH (ref 70–99)
Potassium: 3.8 mmol/L (ref 3.5–5.1)
Sodium: 132 mmol/L — ABNORMAL LOW (ref 135–145)

## 2019-08-08 LAB — TROPONIN I (HIGH SENSITIVITY): Troponin I (High Sensitivity): 7 ng/L (ref <18)

## 2019-08-08 MED ORDER — TRULICITY 1.5 MG/0.5ML ~~LOC~~ SOAJ: 1.0000 "pen " | SUBCUTANEOUS | 4 refills | Status: DC

## 2019-08-08 MED ORDER — INSULIN ASPART 100 UNIT/ML ~~LOC~~ SOLN: 8.0000 [IU] | Freq: Three times a day (TID) | SUBCUTANEOUS | 1 refills | Status: DC

## 2019-08-08 MED ORDER — NYSTATIN-TRIAMCINOLONE 100000-0.1 UNIT/GM-% EX OINT: 1.0000 "application " | TOPICAL_OINTMENT | Freq: Two times a day (BID) | CUTANEOUS | 0 refills | Status: DC

## 2019-08-08 MED ORDER — METFORMIN HCL 500 MG PO TABS: 1000.0000 mg | ORAL_TABLET | Freq: Two times a day (BID) | ORAL | 1 refills | Status: DC

## 2019-08-08 MED ORDER — HYDRALAZINE HCL 25 MG PO TABS: 25.0000 mg | ORAL_TABLET | Freq: Three times a day (TID) | ORAL | 0 refills | Status: DC

## 2019-08-08 NOTE — ED Notes (Signed)
Pt sitting on bench when I entered the room in NAD, pt A&Ox4. Pt reports sister has walking pneumonia so she wanted to get checked out. She reports she has had a mild cough but no SHOB or distress.

## 2019-08-08 NOTE — ED Provider Notes (Signed)
[facility] Emergency Department Provider Note       Time seen: ----------------------------------------- 10:50 AM on 08/08/2019 -----------------------------------------   I have reviewed the triage vital signs and the nursing notes.  HISTORY   Chief Complaint Shortness of Breath    HPI Jessica [name] is a 68 y.o. female with a history of diabetes and GI bleeding who presents to the ED for shortness of breath for the past 2 days.  Patient states she feels fine at this time.  She states her sister was recently diagnosed with walking pneumonia.  She denies fevers or chills, states she occasionally has shortness of breath and chest pain.  Denies vomiting or diarrhea.  Past Medical History:  Diagnosis Date  . Diabetes mellitus without complication [facility])     Patient Active Problem List   Diagnosis Date Noted  . GI bleed 01/23/2019  . Diabetes ([place]) 01/23/2019    Past Surgical History:  Procedure Laterality Date  . NO PAST SURGERIES      Allergies Patient has no known allergies.  Social History Social History   Tobacco Use  . Smoking status: Never Smoker  . Smokeless tobacco: Never Used  Substance Use Topics  . Alcohol use: Not Currently  . Drug use: Never   Review of Systems Constitutional: Negative for fever. Cardiovascular: Positive for occasional chest pain Respiratory: Positive for shortness of breath Gastrointestinal: Negative for abdominal pain, vomiting and diarrhea. Musculoskeletal: Negative for back pain. Skin: Negative for rash. Neurological: Negative for headaches, focal weakness or numbness.  All systems negative/normal/unremarkable except as stated in the HPI  ____________________________________________   PHYSICAL EXAM:  VITAL SIGNS: ED Triage Vitals [08/08/19 1038]  Enc Vitals Group     BP (!) 117/97     Pulse Rate (!) 110     Resp 16     Temp 98.2 F (36.8 C)     Temp Source Oral     SpO2 100 %     Weight 184  lb (83.5 kg)     Height 5\' 7"  (1.702 m)     Head Circumference      Peak Flow      Pain Score 0     Pain Loc      Pain Edu?      Excl. in [place]?    Constitutional: Alert and oriented. Well appearing and in no distress. Eyes: Conjunctivae are normal. Normal extraocular movements. ENT      Head: Normocephalic and atraumatic.      Nose: No congestion/rhinnorhea.      Mouth/Throat: Mucous membranes are moist.      Neck: No stridor. Cardiovascular: Normal rate, regular rhythm. No murmurs, rubs, or gallops. Respiratory: Normal respiratory effort without tachypnea nor retractions. Breath sounds are clear and equal bilaterally. No wheezes/rales/rhonchi. Gastrointestinal: Soft and nontender. Normal bowel sounds Musculoskeletal: Nontender with normal range of motion in extremities. No lower extremity tenderness nor edema. Neurologic:  Normal speech and language. No gross focal neurologic deficits are appreciated.  Skin:  Skin is warm, dry and intact. No rash noted. Psychiatric: Mood and affect are normal. Speech and behavior are normal.  ____________________________________________  EKG: Interpreted by me.  Sinus tachycardia with a rate of 104 bpm, normal PR interval, normal QRS, normal QT  ____________________________________________  ED COURSE:  As part of my medical decision making, I reviewed the following data within the [place] History obtained from family if available, nursing notes, old chart and ekg, as well as notes  from prior ED visits. Patient presented for shortness of breath, we will assess with labs and imaging as indicated at this time.   Procedures  Jessica [name] was evaluated in Emergency Department on 08/08/2019 for the symptoms described in the history of present illness. She was evaluated in the context of the global COVID-19 pandemic, which necessitated consideration that the patient might be at risk for infection with the SARS-CoV-2 virus that causes  COVID-19. Institutional protocols and algorithms that pertain to the evaluation of patients at risk for COVID-19 are in a state of rapid change based on information released by regulatory bodies including the CDC and federal and state organizations. These policies and algorithms were followed during the patient's care in the ED.  ____________________________________________   LABS (pertinent positives/negatives)  Labs Reviewed  BASIC METABOLIC PANEL - Abnormal; Notable for the following components:      Result Value   Sodium 132 (*)    Glucose, Bld 459 (*)    GFR calc non Af Amer 59 (*)    All other components within normal limits  CBC WITH DIFFERENTIAL/PLATELET  TROPONIN I (HIGH SENSITIVITY)    RADIOLOGY Images were viewed by me  Chest x-ray IMPRESSION:  No active cardiopulmonary disease.  ____________________________________________   DIFFERENTIAL DIAGNOSIS   Anxiety, arrhythmia, MI, PE, pneumonia  FINAL ASSESSMENT AND PLAN  Dyspnea, hyperglycemia   Plan: The patient had presented for occasional dyspnea. Patient's labs did indicate significant hyperglycemia although she has no symptoms from this. Patient's imaging was unremarkable.  I will refill her medications, as it turns out she has not been taking her medications for several weeks.  She is cleared for outpatient follow-up.   Laurence Aly, MD    Note: This note was generated in part or whole with voice recognition software. Voice recognition is usually quite accurate but there are transcription errors that can and very often do occur. I apologize for any typographical errors that were not detected and corrected.     Earleen Newport, MD 08/08/19 1302

## 2019-08-08 NOTE — ED Triage Notes (Signed)
Sob X 2 days. Pt reports she feels 100% fine. Pt alert and oriented X4, cooperative, RR even and unlabored, color WNL. Pt in NAD.

## 2019-09-06 ENCOUNTER — Telehealth: Payer: Self-pay

## 2019-09-06 NOTE — Telephone Encounter (Signed)
Called patient to do their pre-visit COVID screening.  Call went to voicemail. Unable to do prescreening.  

## 2019-09-08 ENCOUNTER — Other Ambulatory Visit: Payer: Self-pay

## 2019-09-08 ENCOUNTER — Inpatient Hospital Stay
Admission: EM | Admit: 2019-09-08 | Discharge: 2019-09-12 | DRG: 378 | Disposition: A | Discharge status: Home / Self Care | Payer: Medicare Other | Attending: Internal Medicine | Admitting: Internal Medicine

## 2019-09-08 ENCOUNTER — Encounter: Payer: Self-pay | Admitting: Emergency Medicine

## 2019-09-08 ENCOUNTER — Inpatient Hospital Stay: Payer: Medicare Other

## 2019-09-08 DIAGNOSIS — K5731 Diverticulosis of large intestine without perforation or abscess with bleeding: Secondary | ICD-10-CM | POA: Diagnosis not present

## 2019-09-08 DIAGNOSIS — K625 Hemorrhage of anus and rectum: Secondary | ICD-10-CM | POA: Diagnosis not present

## 2019-09-08 DIAGNOSIS — D62 Acute posthemorrhagic anemia: Secondary | ICD-10-CM | POA: Diagnosis not present

## 2019-09-08 DIAGNOSIS — Z800 Family history of malignant neoplasm of digestive organs: Secondary | ICD-10-CM

## 2019-09-08 DIAGNOSIS — E119 Type 2 diabetes mellitus without complications: Secondary | ICD-10-CM | POA: Diagnosis present

## 2019-09-08 DIAGNOSIS — I10 Essential (primary) hypertension: Secondary | ICD-10-CM | POA: Diagnosis present

## 2019-09-08 DIAGNOSIS — Z79899 Other long term (current) drug therapy: Secondary | ICD-10-CM | POA: Diagnosis not present

## 2019-09-08 DIAGNOSIS — K5790 Diverticulosis of intestine, part unspecified, without perforation or abscess without bleeding: Secondary | ICD-10-CM | POA: Diagnosis not present

## 2019-09-08 DIAGNOSIS — K635 Polyp of colon: Secondary | ICD-10-CM | POA: Diagnosis not present

## 2019-09-08 DIAGNOSIS — D123 Benign neoplasm of transverse colon: Secondary | ICD-10-CM | POA: Diagnosis not present

## 2019-09-08 DIAGNOSIS — K921 Melena: Secondary | ICD-10-CM | POA: Diagnosis not present

## 2019-09-08 DIAGNOSIS — Z794 Long term (current) use of insulin: Secondary | ICD-10-CM | POA: Diagnosis not present

## 2019-09-08 DIAGNOSIS — Z03818 Encounter for observation for suspected exposure to other biological agents ruled out: Secondary | ICD-10-CM | POA: Diagnosis not present

## 2019-09-08 DIAGNOSIS — Z833 Family history of diabetes mellitus: Secondary | ICD-10-CM | POA: Diagnosis not present

## 2019-09-08 DIAGNOSIS — E876 Hypokalemia: Secondary | ICD-10-CM | POA: Diagnosis present

## 2019-09-08 DIAGNOSIS — K922 Gastrointestinal hemorrhage, unspecified: Secondary | ICD-10-CM

## 2019-09-08 DIAGNOSIS — K5730 Diverticulosis of large intestine without perforation or abscess without bleeding: Secondary | ICD-10-CM | POA: Diagnosis not present

## 2019-09-08 DIAGNOSIS — Z20828 Contact with and (suspected) exposure to other viral communicable diseases: Secondary | ICD-10-CM | POA: Diagnosis present

## 2019-09-08 DIAGNOSIS — K219 Gastro-esophageal reflux disease without esophagitis: Secondary | ICD-10-CM | POA: Diagnosis present

## 2019-09-08 HISTORY — DX: Gastrointestinal hemorrhage, unspecified: K92.2

## 2019-09-08 LAB — CBC
HCT: 21.1 % — ABNORMAL LOW (ref 36.0–46.0)
Hemoglobin: 6.7 g/dL — ABNORMAL LOW (ref 12.0–15.0)
MCH: 26.6 pg (ref 26.0–34.0)
MCHC: 31.8 g/dL (ref 30.0–36.0)
MCV: 83.7 fL (ref 80.0–100.0)
Platelets: 206 10*3/uL (ref 150–400)
RBC: 2.52 MIL/uL — ABNORMAL LOW (ref 3.87–5.11)
RDW: 13.2 % (ref 11.5–15.5)
WBC: 10 10*3/uL (ref 4.0–10.5)
nRBC: 0 % (ref 0.0–0.2)

## 2019-09-08 LAB — COMPREHENSIVE METABOLIC PANEL
ALT: 12 U/L (ref 0–44)
AST: 19 U/L (ref 15–41)
Albumin: 3.4 g/dL — ABNORMAL LOW (ref 3.5–5.0)
Alkaline Phosphatase: 58 U/L (ref 38–126)
Anion gap: 12 (ref 5–15)
BUN: 15 mg/dL (ref 8–23)
CO2: 18 mmol/L — ABNORMAL LOW (ref 22–32)
Calcium: 8.5 mg/dL — ABNORMAL LOW (ref 8.9–10.3)
Chloride: 107 mmol/L (ref 98–111)
Creatinine, Ser: 0.86 mg/dL (ref 0.44–1.00)
GFR calc Af Amer: >60 mL/min (ref >60)
GFR calc non Af Amer: >60 mL/min (ref >60)
Glucose, Bld: 316 mg/dL — ABNORMAL HIGH (ref 70–99)
Potassium: 3.8 mmol/L (ref 3.5–5.1)
Sodium: 137 mmol/L (ref 135–145)
Total Bilirubin: 0.7 mg/dL (ref 0.3–1.2)
Total Protein: 5.9 g/dL — ABNORMAL LOW (ref 6.5–8.1)

## 2019-09-08 LAB — GLUCOSE, CAPILLARY: Glucose-Capillary: 251 mg/dL — ABNORMAL HIGH (ref 70–99)

## 2019-09-08 LAB — PREPARE RBC (CROSSMATCH)

## 2019-09-08 LAB — HEMOGLOBIN A1C
Hgb A1c MFr Bld: 10 % — ABNORMAL HIGH (ref 4.8–5.6)
Mean Plasma Glucose: 240.3 mg/dL

## 2019-09-08 MED ORDER — PANTOPRAZOLE SODIUM 40 MG IV SOLR
40.0000 mg | Freq: Two times a day (BID) | INTRAVENOUS | Status: DC
  Administered 2019-09-08 – 2019-09-10 (×4): 40 mg via INTRAVENOUS
  Filled 2019-09-08 (×4): qty 40

## 2019-09-08 MED ORDER — INSULIN ASPART 100 UNIT/ML ~~LOC~~ SOLN
0.0000 [IU] | Freq: Every day | SUBCUTANEOUS | Status: DC
  Administered 2019-09-08: 3 [IU] via SUBCUTANEOUS
  Filled 2019-09-08: qty 1

## 2019-09-08 MED ORDER — SODIUM CHLORIDE 0.9 % IV SOLN
10.0000 mL/h | Freq: Once | INTRAVENOUS | Status: AC
  Administered 2019-09-08: 10 mL/h via INTRAVENOUS

## 2019-09-08 MED ORDER — INSULIN ASPART 100 UNIT/ML ~~LOC~~ SOLN
0.0000 [IU] | Freq: Three times a day (TID) | SUBCUTANEOUS | Status: DC
  Administered 2019-09-09: 2 [IU] via SUBCUTANEOUS
  Administered 2019-09-09: 1 [IU] via SUBCUTANEOUS
  Administered 2019-09-09 – 2019-09-10 (×2): 2 [IU] via SUBCUTANEOUS
  Administered 2019-09-10 – 2019-09-11 (×2): 1 [IU] via SUBCUTANEOUS
  Administered 2019-09-11 – 2019-09-12 (×2): 3 [IU] via SUBCUTANEOUS
  Filled 2019-09-08 (×8): qty 1

## 2019-09-08 MED ORDER — SODIUM CHLORIDE 0.9 % IV SOLN
INTRAVENOUS | Status: DC
  Administered 2019-09-08 – 2019-09-11 (×6): via INTRAVENOUS

## 2019-09-08 NOTE — ED Notes (Signed)
Pt's breathing under control. Pt much less anxious. Will complete orthostatic vitals now.

## 2019-09-08 NOTE — Progress Notes (Signed)
Care Alignment Note  Advanced Directives Documents (Living Will, Power of Attorney) currently in the EHR documents reviewed.  Has the patient discussed their wishes with their family/healthcare power of attorney no.  What does the patient/decision maker understand about their medical condition and the natural course of their disease.  Ongoing GI bleed.  Diabetes mellitus  What is the patient/decision maker's biggest fear or concern for the future pain and suffering   What is the most important goal for this patient should their health condition worsen maintenance of function.  Current   Code Status: Full Code  Current code status has been reviewed/updated.  Time spent:17 minutes

## 2019-09-08 NOTE — ED Notes (Addendum)
Pt tearful and anxious. Triage RN and this RN reassuring pt. Family member at bedside. Rainbow including blue tube and pink tube sent to lab.

## 2019-09-08 NOTE — ED Provider Notes (Signed)
[facility] Emergency Department Provider Note       Time seen: ----------------------------------------- 6:24 PM on 09/08/2019 -----------------------------------------   I have reviewed the triage vital signs and the nursing notes.  HISTORY   Chief Complaint Rectal Bleeding    HPI Jessica [name] is a 68 y.o. female with a history of diabetes who presents to the ED for gastrointestinal bleeding.  Patient arrives by private vehicle stating she has had about 4 large episodes of bright red blood per rectum since yesterday.  Had similar happen in February but she did not follow-up with GI because it resolved spontaneously.  She is also upset because her mother died 46 month ago.  Past Medical History:  Diagnosis Date  . Diabetes mellitus without complication [facility])     Patient Active Problem List   Diagnosis Date Noted  . GI bleed 01/23/2019  . Diabetes ([place]) 01/23/2019    Past Surgical History:  Procedure Laterality Date  . NO PAST SURGERIES      Allergies Patient has no known allergies.  Social History Social History   Tobacco Use  . Smoking status: Never Smoker  . Smokeless tobacco: Never Used  Substance Use Topics  . Alcohol use: Not Currently  . Drug use: Never   Review of Systems Constitutional: Negative for fever. Cardiovascular: Negative for chest pain. Respiratory: Negative for shortness of breath. Gastrointestinal: Negative for abdominal pain, vomiting and diarrhea.  Positive for rectal bleeding  musculoskeletal: Negative for back pain. Skin: Negative for rash. Neurological: Negative for headaches, focal weakness or numbness.  All systems negative/normal/unremarkable except as stated in the HPI  ____________________________________________   PHYSICAL EXAM:  VITAL SIGNS: ED Triage Vitals  Enc Vitals Group     BP 09/08/19 1809 117/66     Pulse Rate 09/08/19 1809 (!) 108     Resp 09/08/19 1809 (!) 32     Temp 09/08/19 1809  98.2 F (36.8 C)     Temp Source 09/08/19 1809 Oral     SpO2 09/08/19 1809 100 %     Weight 09/08/19 1810 180 lb (81.6 kg)     Height 09/08/19 1810 5\' 9"  (1.753 m)     Head Circumference --      Peak Flow --      Pain Score 09/08/19 1809 0     Pain Loc --      Pain Edu? --      Excl. in [place]? --    Constitutional: Alert and oriented.  Anxious and tearful Eyes: Conjunctivae are normal. Normal extraocular movements. ENT      Head: Normocephalic and atraumatic.      Nose: No congestion/rhinnorhea.      Mouth/Throat: Mucous membranes are moist.      Neck: No stridor. Cardiovascular: Rapid rate, regular rhythm. No murmurs, rubs, or gallops. Respiratory: Tachypnea with clear breath sounds Gastrointestinal: Soft and nontender. Normal bowel sounds Musculoskeletal: Nontender with normal range of motion in extremities. No lower extremity tenderness nor edema. Neurologic:  Normal speech and language. No gross focal neurologic deficits are appreciated.  Skin:  Skin is warm, dry and intact. No rash noted. Psychiatric: Depressed mood and affect ____________________________________________  ED COURSE:  As part of my medical decision making, I reviewed the following data within the [place] History obtained from family if available, nursing notes, old chart and ekg, as well as notes from prior ED visits. Patient presented for rectal bleeding, we will assess with labs and imaging as  indicated at this time.   Procedures  Jessica [name] was evaluated in Emergency Department on 09/08/2019 for the symptoms described in the history of present illness. She was evaluated in the context of the global COVID-19 pandemic, which necessitated consideration that the patient might be at risk for infection with the SARS-CoV-2 virus that causes COVID-19. Institutional protocols and algorithms that pertain to the evaluation of patients at risk for COVID-19 are in a state of rapid change based on  information released by regulatory bodies including the CDC and federal and state organizations. These policies and algorithms were followed during the patient's care in the ED.  ____________________________________________   LABS (pertinent positives/negatives)  Labs Reviewed  COMPREHENSIVE METABOLIC PANEL - Abnormal; Notable for the following components:      Result Value   CO2 18 (*)    Glucose, Bld 316 (*)    Calcium 8.5 (*)    Total Protein 5.9 (*)    Albumin 3.4 (*)    All other components within normal limits  CBC - Abnormal; Notable for the following components:   RBC 2.52 (*)    Hemoglobin 6.7 (*)    HCT 21.1 (*)    All other components within normal limits  POC OCCULT BLOOD, ED  TYPE AND SCREEN  PREPARE RBC (CROSSMATCH)   CRITICAL CARE Performed by: Laurence Aly   Total critical care time: 30 minutes  Critical care time was exclusive of separately billable procedures and treating other patients.  Critical care was necessary to treat or prevent imminent or life-threatening deterioration.  Critical care was time spent personally by me on the following activities: development of treatment plan with patient and/or surrogate as well as nursing, discussions with consultants, evaluation of patient's response to treatment, examination of patient, obtaining history from patient or surrogate, ordering and performing treatments and interventions, ordering and review of laboratory studies, ordering and review of radiographic studies, pulse oximetry and re-evaluation of patient's condition.  ____________________________________________   DIFFERENTIAL DIAGNOSIS   Rectal bleeding, anemia, coagulopathy, adjustment disorder, depression  FINAL ASSESSMENT AND PLAN  Gastrointestinal bleeding, acute blood loss anemia   Plan: The patient had presented for gastrointestinal bleeding. Patient's labs did indicate significant anemia with a hemoglobin of 6.7.  I have ordered 2  units of packed red blood cells for transfusion.  She will certainly need a colonoscopy, has not had one this year with her gastrointestinal bleeding episodes.  Currently vital signs are stable.  I will discuss with the hospitalist for admission.  Laurence Aly, MD    Note: This note was generated in part or whole with voice recognition software. Voice recognition is usually quite accurate but there are transcription errors that can and very often do occur. I apologize for any typographical errors that were not detected and corrected.     Earleen Newport, MD 09/08/19 [phone]

## 2019-09-08 NOTE — ED Notes (Addendum)
EDP at bedside. Pt given 2 warm blankets.

## 2019-09-08 NOTE — ED Notes (Signed)
Floor states they will call this RN back in 51mins for report. Name and ascom number given.

## 2019-09-08 NOTE — ED Notes (Signed)
IV team at bedside 

## 2019-09-08 NOTE — ED Notes (Signed)
This RN tried for 2nd IV x3. Will place IV team order.

## 2019-09-08 NOTE — ED Notes (Signed)
Pt tolerating blood well. Denies any s/s of blood reaction.

## 2019-09-08 NOTE — H&P (Addendum)
[facility] [place] NAME: Jessica [name]    MR#:  [id number]  DATE OF BIRTH:  16-May-1951  DATE OF ADMISSION:  09/08/2019  PRIMARY CARE PHYSICIAN: System, Pcp Not In   REQUESTING/REFERRING PHYSICIAN: Lenise Arena  CHIEF COMPLAINT:   Chief Complaint  Patient presents with  . Rectal Bleeding    HISTORY OF PRESENT ILLNESS:  Jessica [name]  is a 68 y.o. female with a known history of diabetes mellitus who presented to the emergency room with complaints of having rectal bleeding.  Described as dark blood with some blood clots.  Has been going on for the last 2 days.  Denies any abdominal pains.  No nausea or vomiting.  Reported having similar problem in February but did not follow-up with GI because the bleeding resolved spontaneously.  Reported having strong family history of colon cancer.  Her mother died from colon cancer.  Last colonoscopy was about 5 years ago.  Patient was evaluated in the emergency room and found to have hemoglobin of 6.7.  2 units of packed red blood cells already ordered to be transfused.  Medical service called to admit patient for further evaluation and management.  PAST MEDICAL HISTORY:   Past Medical History:  Diagnosis Date  . Diabetes mellitus without complication ([place])     PAST SURGICAL HISTORY:   Past Surgical History:  Procedure Laterality Date  . NO PAST SURGERIES      SOCIAL HISTORY:   Social History   Tobacco Use  . Smoking status: Never Smoker  . Smokeless tobacco: Never Used  Substance Use Topics  . Alcohol use: Not Currently    FAMILY HISTORY:  Positive for colon cancer.  And positive for diabetes mellitus.  DRUG ALLERGIES:  No Known Allergies  REVIEW OF SYSTEMS:   Review of Systems  Constitutional: Negative for chills and fever.  HENT: Negative for hearing loss and tinnitus.   Eyes: Negative for blurred vision and double vision.  Respiratory: Negative for cough and shortness of  breath.   Cardiovascular: Negative for chest pain and palpitations.  Gastrointestinal: Positive for blood in stool. Negative for heartburn, nausea and vomiting.  Genitourinary: Negative for dysuria and urgency.  Musculoskeletal: Negative for myalgias and neck pain.  Skin: Negative for itching and rash.  Neurological: Negative for dizziness and headaches.  Psychiatric/Behavioral: Negative for depression and hallucinations.    MEDICATIONS AT HOME:   Prior to Admission medications   Medication Sig Start Date End Date Taking? Authorizing Provider  Dulaglutide (TRULICITY) 1.5 0000000 SOPN Inject 1 pen into the skin every 7 (seven) days. 08/08/19   Earleen Newport, MD  hydrALAZINE (APRESOLINE) 25 MG tablet Take 1 tablet (25 mg total) by mouth 3 (three) times daily. 08/08/19 08/07/20  Earleen Newport, MD  insulin aspart (NOVOLOG) 100 UNIT/ML injection Inject 8 Units into the skin 3 (three) times daily before meals. 08/08/19   Earleen Newport, MD  metFORMIN (GLUCOPHAGE) 500 MG tablet Take 2 tablets (1,000 mg total) by mouth 2 (two) times daily with a meal. 08/08/19   Earleen Newport, MD  nystatin-triamcinolone ointment [facility]) Apply 1 application topically 2 (two) times daily. For vaginal candidiasis 08/08/19   Earleen Newport, MD      VITAL SIGNS:  Blood pressure (!) 144/79, pulse (!) 107, temperature 99.2 F (37.3 C), temperature source Oral, resp. rate 16, height 5\' 9"  (1.753 m), weight 81.6 kg, SpO2 100 %.  PHYSICAL EXAMINATION:  Physical Exam  GENERAL:  68 y.o.-year-old patient lying in the bed with no acute distress.  EYES: Pupils equal, round, reactive to light and accommodation. No scleral icterus. Extraocular muscles intact.  HEENT: Head atraumatic, normocephalic. Oropharynx and nasopharynx clear.  NECK:  Supple, no jugular venous distention. No thyroid enlargement, no tenderness.  LUNGS: Normal breath sounds bilaterally, no wheezing, rales,rhonchi or  crepitation. No use of accessory muscles of respiration.  CARDIOVASCULAR: S1, S2 normal. No murmurs, rubs, or gallops.  ABDOMEN: Soft, nontender, nondistended. Bowel sounds present. No organomegaly or mass.  EXTREMITIES: No pedal edema, cyanosis, or clubbing.  NEUROLOGIC: Cranial nerves II through XII are intact. Muscle strength 5/5 in all extremities. Sensation intact. Gait not checked.  PSYCHIATRIC: The patient is alert and oriented x 3.  SKIN: No obvious rash, lesion, or ulcer.   LABORATORY PANEL:   CBC Recent Labs  Lab 09/08/19 1813  WBC 10.0  HGB 6.7*  HCT 21.1*  PLT 206   ------------------------------------------------------------------------------------------------------------------  Chemistries  Recent Labs  Lab 09/08/19 1813  NA 137  K 3.8  CL 107  CO2 18*  GLUCOSE 316*  BUN 15  CREATININE 0.86  CALCIUM 8.5*  AST 19  ALT 12  ALKPHOS 58  BILITOT 0.7   ------------------------------------------------------------------------------------------------------------------  Cardiac Enzymes No results for input(s): TROPONINI in the last 168 hours. ------------------------------------------------------------------------------------------------------------------  RADIOLOGY:  No results found.    IMPRESSION AND PLAN:  Patient is a 68 year old female with history of diabetes mellitus being admitted for evaluation of GI bleed  1.  GI bleeding Patient reported having dark bloody stool with some blood clots.  No nausea or vomiting. Hemoglobin down to 6.7.  Patient denies being on any NSAIDs or aspirin. Reports strong family history of colon cancer.  Her mother died of colon cancer.  Last colonoscopy was 5 years ago. Kept n.p.o.  To be transfused with 2 units of packed red blood cells tonight.  Monitor hemoglobin and hematocrit every 8 hours. GI consult placed.  Message sent to Dr. Marius Ditch via Raeanne Gathers and she recommended bleeding scan which has been ordered now. IV fluid  hydration  2.  Diabetes mellitus type 2 List of home medications not yet confirmed.  To resume home meds after medication reconciliation is done. Placed on sliding scale insulin coverage.  Glycosylated hemoglobin level in a.m.  DVT prophylaxis; SCDs Avoid heparin products due to GI bleed   All the records are reviewed and case discussed with ED provider. Management plans discussed with the patient, family and they are in agreement. Updated family member present in the room and all questions answered.  CODE STATUS: Full code  TOTAL TIME TAKING CARE OF THIS PATIENT: 60 minutes.    Brit Wernette M.D on 09/08/2019 at 7:35 PM  Between 7am to 6pm - Pager - [phone]  After 6pm go to [email or web address] - Proofreader  [facility]  Office  [phone]  CC: Primary care physician; System, Pcp Not In   Note: This dictation was prepared with Dragon dictation along with smaller phrase technology. Any transcriptional errors that result from this process are unintentional.

## 2019-09-08 NOTE — ED Triage Notes (Signed)
Patient arrives by private car with daughter in law. In wheelchair is pale, diaphoretic and slumping in chair. Can answer word or two. To room 25 and recline on stretcher with skin drying and able to speak. States rectal bleeding x 4 since yesterday, red. Patient states history of same in February but did not follow up with GI because she was better.

## 2019-09-08 NOTE — ED Notes (Signed)
Pt given more warm blankets by NT Alissa.

## 2019-09-09 ENCOUNTER — Encounter: Payer: Self-pay | Admitting: Radiology

## 2019-09-09 LAB — BASIC METABOLIC PANEL
Anion gap: 7 (ref 5–15)
BUN: 13 mg/dL (ref 8–23)
CO2: 22 mmol/L (ref 22–32)
Calcium: 8.2 mg/dL — ABNORMAL LOW (ref 8.9–10.3)
Chloride: 112 mmol/L — ABNORMAL HIGH (ref 98–111)
Creatinine, Ser: 0.85 mg/dL (ref 0.44–1.00)
GFR calc Af Amer: >60 mL/min (ref >60)
GFR calc non Af Amer: >60 mL/min (ref >60)
Glucose, Bld: 168 mg/dL — ABNORMAL HIGH (ref 70–99)
Potassium: 3.3 mmol/L — ABNORMAL LOW (ref 3.5–5.1)
Sodium: 141 mmol/L (ref 135–145)

## 2019-09-09 LAB — CBC
HCT: 24.8 % — ABNORMAL LOW (ref 36.0–46.0)
Hemoglobin: 8.3 g/dL — ABNORMAL LOW (ref 12.0–15.0)
MCH: 27.9 pg (ref 26.0–34.0)
MCHC: 33.5 g/dL (ref 30.0–36.0)
MCV: 83.5 fL (ref 80.0–100.0)
Platelets: 150 10*3/uL (ref 150–400)
RBC: 2.97 MIL/uL — ABNORMAL LOW (ref 3.87–5.11)
RDW: 13.3 % (ref 11.5–15.5)
WBC: 8.1 10*3/uL (ref 4.0–10.5)
nRBC: 0 % (ref 0.0–0.2)

## 2019-09-09 LAB — MAGNESIUM: Magnesium: 1.9 mg/dL (ref 1.7–2.4)

## 2019-09-09 LAB — HEMOGLOBIN AND HEMATOCRIT, BLOOD
HCT: 20.9 % — ABNORMAL LOW (ref 36.0–46.0)
HCT: 24.5 % — ABNORMAL LOW (ref 36.0–46.0)
Hemoglobin: 6.8 g/dL — ABNORMAL LOW (ref 12.0–15.0)
Hemoglobin: 8.2 g/dL — ABNORMAL LOW (ref 12.0–15.0)

## 2019-09-09 LAB — GLUCOSE, CAPILLARY
Glucose-Capillary: 175 mg/dL — ABNORMAL HIGH (ref 70–99)
Glucose-Capillary: 170 mg/dL — ABNORMAL HIGH (ref 70–99)
Glucose-Capillary: 140 mg/dL — ABNORMAL HIGH (ref 70–99)
Glucose-Capillary: 140 mg/dL — ABNORMAL HIGH (ref 70–99)

## 2019-09-09 LAB — HEMOGLOBIN A1C
Hgb A1c MFr Bld: 8.2 % — ABNORMAL HIGH (ref 4.8–5.6)
Mean Plasma Glucose: 188.64 mg/dL

## 2019-09-09 LAB — SARS CORONAVIRUS 2 (TAT 6-24 HRS): SARS Coronavirus 2: NEGATIVE

## 2019-09-09 MED ORDER — SODIUM CHLORIDE 0.9 % IV SOLN
INTRAVENOUS | Status: DC | PRN
  Administered 2019-09-09: 10 mL via INTRAVENOUS
  Administered 2019-09-11: 13:00:00 via INTRAVENOUS

## 2019-09-09 MED ORDER — POTASSIUM CHLORIDE 10 MEQ/100ML IV SOLN
10.0000 meq | INTRAVENOUS | Status: AC
  Administered 2019-09-09 (×2): 10 meq via INTRAVENOUS
  Filled 2019-09-09 (×2): qty 100

## 2019-09-09 MED ORDER — TECHNETIUM TC 99M-LABELED RED BLOOD CELLS IV KIT
20.0000 | PACK | Freq: Once | INTRAVENOUS | Status: AC | PRN
  Administered 2019-09-09: 20.76 via INTRAVENOUS

## 2019-09-09 MED ORDER — PEG 3350-KCL-NA BICARB-NACL 420 G PO SOLR
4000.0000 mL | Freq: Once | ORAL | Status: AC
  Administered 2019-09-09: 4000 mL via ORAL
  Filled 2019-09-09: qty 4000

## 2019-09-09 MED ORDER — SODIUM CHLORIDE 0.9 % IV SOLN
510.0000 mg | Freq: Once | INTRAVENOUS | Status: AC
  Administered 2019-09-09: 510 mg via INTRAVENOUS
  Filled 2019-09-09: qty 17

## 2019-09-09 NOTE — Consult Note (Addendum)
Cephas Darby, MD [address]  [place]  [place], [place] [address]  Main: [phone]  Fax: [phone] Pager: [phone]   Consultation  Referring Provider:     No ref. provider found Primary Care Physician:  System, Pcp Not In Primary Gastroenterologist:  Dr. Alice Reichert         Reason for Consultation:     Hematochezia  Date of Admission:  09/08/2019 Date of Consultation:  09/09/2019         HPI:   Simya Tercero is a 68 y.o. female with history of diabetes who presented to ER yesterday due to several episodes of painless bleeding per rectum associated with diaphoresis, severe weakness.  Patient had similar episode in 01/2019 for which she was admitted and colonoscopy was recommended at that time by Dr. Alice Reichert.  She chose not to as she had to leave for closing of her house.  Her hemoglobin dropped to 9.9 in 01/2019.  Improved to 12.7 on 8/19.  It was further decreased to 6.7 on current admission, received units of PRBCs and responded appropriately to 8.3 this morning.  Normal BUN/creatinine.  Patient reports feeling weak, denies abdominal pain, nausea or vomiting.  She reports her stools are burgundy colored.  She reports having had a colonoscopy about 5 years ago in [place].  Unknown results.  She denies family history of GI malignancy.   NSAIDs: None  Antiplts/Anticoagulants/Anti thrombotics: None  GI Procedures: Colonoscopy neurology about 5 years ago  Past Medical History:  Diagnosis Date  . Diabetes mellitus without complication ([place])   . GI bleed 09/08/2019    Past Surgical History:  Procedure Laterality Date  . NO PAST SURGERIES      Prior to Admission medications   Medication Sig Start Date End Date Taking? Authorizing Provider  Dulaglutide (TRULICITY) 1.5 NO/7.0JG SOPN Inject 1 pen into the skin every 7 (seven) days. 08/08/19  Yes Earleen Newport, MD  hydrALAZINE (APRESOLINE) 25 MG tablet Take 1 tablet (25 mg total) by mouth 3 (three) times daily.  08/08/19 08/07/20 Yes Earleen Newport, MD  insulin aspart (NOVOLOG) 100 UNIT/ML injection Inject 8 Units into the skin 3 (three) times daily before meals. 08/08/19  Yes Earleen Newport, MD  metFORMIN (GLUCOPHAGE) 500 MG tablet Take 2 tablets (1,000 mg total) by mouth 2 (two) times daily with a meal. 08/08/19  Yes Earleen Newport, MD  nystatin-triamcinolone ointment [facility]) Apply 1 application topically 2 (two) times daily. For vaginal candidiasis 08/08/19  Yes Earleen Newport, MD    Current Facility-Administered Medications:  .  0.9 %  sodium chloride infusion, , Intravenous, Continuous, Ojie, Jude, MD, Last Rate: 100 mL/hr at 09/09/19 0647 .  0.9 %  sodium chloride infusion, , Intravenous, PRN, [name], Pete Pelt, MD, Last Rate: 10 mL/hr at 09/09/19 1130, 10 mL at 09/09/19 1130 .  ferumoxytol (FERAHEME) 510 mg in sodium chloride 0.9 % 100 mL IVPB, 510 mg, Intravenous, Once, Kijuan Gallicchio, Tally Due, MD, Last Rate: 468 mL/hr at 09/09/19 1134, 510 mg at 09/09/19 1134 .  insulin aspart (novoLOG) injection 0-5 Units, 0-5 Units, Subcutaneous, QHS, Ojie, Jude, MD, 3 Units at 09/08/19 2150 .  insulin aspart (novoLOG) injection 0-9 Units, 0-9 Units, Subcutaneous, TID WC, Ojie, Jude, MD, 2 Units at 09/09/19 0759 .  pantoprazole (PROTONIX) injection 40 mg, 40 mg, Intravenous, Q12H, Ojie, Jude, MD, 40 mg at 09/09/19 1111 .  polyethylene glycol-electrolytes (NuLYTELY/GoLYTELY) solution 4,000 mL, 4,000 mL, Oral, Once, [place]  Reece Levy, MD .  potassium chloride 10 mEq in 100 mL IVPB, 10 mEq, Intravenous, Q1 Hr x 2, [name], Pete Pelt, MD, Last Rate: 100 mL/hr at 09/09/19 1124, 10 mEq at 09/09/19 1124  History reviewed. No pertinent family history.   Social History   Tobacco Use  . Smoking status: Never Smoker  . Smokeless tobacco: Never Used  Substance Use Topics  . Alcohol use: Not Currently  . Drug use: Never    Allergies as of 09/08/2019  . (No Known Allergies)    Review of Systems:     All systems reviewed and negative except where noted in HPI.   Physical Exam:  Vital signs in last 24 hours: Temp:  [98.2 F (36.8 C)-99.2 F (37.3 C)] 98.2 F (36.8 C) (09/20 0756) Pulse Rate:  [84-108] 84 (09/20 0756) Resp:  [16-40] 20 (09/20 0756) BP: (111-153)/(62-93) 132/62 (09/20 0756) SpO2:  [97 %-100 %] 99 % (09/20 0756) Weight:  [81.6 kg] 81.6 kg (09/19 1810) Last BM Date: 09/08/19 General:   Pleasant, cooperative in NAD Head:  Normocephalic and atraumatic. Eyes:   No icterus.   Conjunctiva pale. PERRLA. Ears:  Normal auditory acuity. Neck:  Supple; no masses or thyroidomegaly Lungs: Respirations even and unlabored. Lungs clear to auscultation bilaterally.   No wheezes, crackles, or rhonchi.  Heart:  Regular rate and rhythm;  Without murmur, clicks, rubs or gallops Abdomen:  Soft, nondistended, nontender. Normal bowel sounds. No appreciable masses or hepatomegaly.  No rebound or guarding.  Rectal:  Not performed. Msk:  Symmetrical without gross deformities.  Strength generalized weakness Extremities:  Without edema, cyanosis or clubbing. Neurologic:  Alert and oriented x3;  grossly normal neurologically. Skin:  Intact without significant lesions or rashes. Cervical Nodes:  No significant cervical adenopathy. Psych:  Alert and cooperative. Normal affect.  LAB RESULTS: CBC Latest Ref Rng & Units 09/09/2019 09/09/2019 09/08/2019  WBC 4.0 - 10.5 K/uL 8.1 - 10.0  Hemoglobin 12.0 - 15.0 g/dL 8.3(L) 6.8(L) 6.7(L)  Hematocrit 36.0 - 46.0 % 24.8(L) 20.9(L) 21.1(L)  Platelets 150 - 400 K/uL 150 - 206    BMET BMP Latest Ref Rng & Units 09/09/2019 09/08/2019 08/08/2019  Glucose 70 - 99 mg/dL 168(H) 316(H) 459(H)  BUN 8 - 23 mg/dL 13 15 10   Creatinine 0.44 - 1.00 mg/dL 0.85 0.86 0.99  Sodium 135 - 145 mmol/L 141 137 132(L)  Potassium 3.5 - 5.1 mmol/L 3.3(L) 3.8 3.8  Chloride 98 - 111 mmol/L 112(H) 107 98  CO2 22 - 32 mmol/L 22 18(L) 23  Calcium 8.9 - 10.3 mg/dL 8.2(L) 8.5(L) 9.6     LFT Hepatic Function Latest Ref Rng & Units 09/08/2019 01/23/2019  Total Protein 6.5 - 8.1 g/dL 5.9(L) 6.8  Albumin 3.5 - 5.0 g/dL 3.4(L) 3.8  AST 15 - 41 U/L 19 16  ALT 0 - 44 U/L 12 13  Alk Phosphatase 38 - 126 U/L 58 80  Total Bilirubin 0.3 - 1.2 mg/dL 0.7 0.5     STUDIES: Nm Gi Blood Loss  Result Date: 09/09/2019 CLINICAL DATA:  GI bleeding EXAM: NUCLEAR MEDICINE GASTROINTESTINAL BLEEDING SCAN TECHNIQUE: Sequential abdominal images were obtained following intravenous administration of Tc-48mlabeled red blood cells. RADIOPHARMACEUTICALS:  20.76 mCi Tc-964mertechnetate in-vitro labeled red cells. COMPARISON:  None. FINDINGS: No progressive, non static foci of activity are visualized over the abdomen or pelvis. Progressive bladder activity is noted. IMPRESSION: Negative.  No acute GI bleeding observed on the current exam. Electronically Signed   By: KiMaudie Mercury  Francoise Ceo M.D.   On: 09/09/2019 02:44      Impression / Plan:   Kariyah Baugh is a 68 y.o. female with history of diabetes, admitted with hemodynamically significant painless rectal bleeding.  Second recurrence since 01/2019.  Most likely diverticular.  Bleeding scan was negative.  Her last episode was last night.  Patient responded appropriately to 2 units of PRBCs.   Clear liquid diet N.p.o. past midnight Bowel prep ordered Colonoscopy tomorrow by Dr. Bonna Gains  Thank you for involving me in the care of this patient.  Will follow along with you    LOS: 1 day   Sherri Sear, MD  09/09/2019, 11:41 AM   Note: This dictation was prepared with Dragon dictation along with smaller phrase technology. Any transcriptional errors that result from this process are unintentional.

## 2019-09-09 NOTE — Progress Notes (Signed)
UNMATCHED BLOOD PRODUCT NOTE  Compare the patient ID on the blood tag to the patient ID on the hospital armband and Blood Bank armband. Then confirm the unit number on the blood tag matches the unit number on the blood product.  If a discrepancy is discovered return the product to blood bank immediately.   Blood Product Type: Packed Red Blood Cells  Unit #: (Found on blood product bag, begins with [id number]  Product Code #: (Found on blood product bag, begins with E) [id number]   Start Time: 0430  Starting Rate: 120 ml/hr  Rate increase/decreased  (if applicable):      ml/hr  Rate changed time (if applicable):    Stop Time: pending - currently running  All Other Documentation should be documented within the Blood Admin Flowsheet per policy.  Was not matched because Blood Bank was not able to release unit from their system - stated it was "locked."

## 2019-09-09 NOTE — Progress Notes (Addendum)
[place] [facility] [place] NAME: Jessica [name]    MR#:  [id number]  DATE OF BIRTH:  Mar 24, 1951  SUBJECTIVE:   She is feeling well this morning.  She has not noticed any additional episodes of bleeding.  She denies any abdominal pain, fevers, chills.  REVIEW OF SYSTEMS:  Review of Systems  Constitutional: Negative for chills and fever.  HENT: Negative for congestion and sore throat.   Eyes: Negative for blurred vision and double vision.  Respiratory: Negative for cough and shortness of breath.   Cardiovascular: Negative for chest pain and palpitations.  Gastrointestinal: Positive for blood in stool. Negative for abdominal pain, nausea and vomiting.  Genitourinary: Negative for dysuria and urgency.  Musculoskeletal: Negative for back pain and neck pain.  Neurological: Negative for dizziness and headaches.  Psychiatric/Behavioral: Negative for depression. The patient is not nervous/anxious.    DRUG ALLERGIES:  No Known Allergies VITALS:  Blood pressure 132/62, pulse 84, temperature 98.2 F (36.8 C), temperature source Oral, resp. rate 20, height 5\' 9"  (1.753 m), weight 81.6 kg, SpO2 99 %. PHYSICAL EXAMINATION:  Physical Exam  GENERAL:  Laying in the bed with no acute distress.  Well-appearing, talkative. HEENT: Head atraumatic, normocephalic. Pupils equal, round, reactive to light and accommodation. No scleral icterus. Extraocular muscles intact. Oropharynx and nasopharynx clear.  NECK:  Supple, no jugular venous distention. No thyroid enlargement. LUNGS: Lungs are clear to auscultation bilaterally. No wheezes, crackles, rhonchi. No use of accessory muscles of respiration.  CARDIOVASCULAR: RRR, S1, S2 normal. No murmurs, rubs, or gallops.  ABDOMEN: Soft, nontender, nondistended. Bowel sounds present.  EXTREMITIES: No pedal edema, cyanosis, or clubbing.  NEUROLOGIC: CN 2-12 intact, no focal deficits. 5/5 muscle strength throughout all  extremities. Sensation intact throughout. Gait not checked.  PSYCHIATRIC: The patient is alert and oriented x 3.  SKIN: No obvious rash, lesion, or ulcer.   LABORATORY PANEL:  Female CBC Recent Labs  Lab 09/09/19 0905  WBC 8.1  HGB 8.3*  HCT 24.8*  PLT 150   ------------------------------------------------------------------------------------------------------------------ Chemistries  Recent Labs  Lab 09/08/19 1813  NA 137  K 3.8  CL 107  CO2 18*  GLUCOSE 316*  BUN 15  CREATININE 0.86  CALCIUM 8.5*  AST 19  ALT 12  ALKPHOS 58  BILITOT 0.7   RADIOLOGY:  Nm Gi Blood Loss  Result Date: 09/09/2019 CLINICAL DATA:  GI bleeding EXAM: NUCLEAR MEDICINE GASTROINTESTINAL BLEEDING SCAN TECHNIQUE: Sequential abdominal images were obtained following intravenous administration of Tc-87m labeled red blood cells. RADIOPHARMACEUTICALS:  20.76 mCi Tc-75m pertechnetate in-vitro labeled red cells. COMPARISON:  None. FINDINGS: No progressive, non static foci of activity are visualized over the abdomen or pelvis. Progressive bladder activity is noted. IMPRESSION: Negative.  No acute GI bleeding observed on the current exam. Electronically Signed   By: Donavan Foil M.D.   On: 09/09/2019 02:44   ASSESSMENT AND PLAN:   GI bleed- may be diverticular bleed.  Patient has not had any additional bleeding episodes. Last colonoscopy was 5 years ago and was completely normal. -Bleeding scan performed yesterday was negative -GI following -Continue IV protonix -Keep n.p.o. for now -Continue IV fluids  Acute blood loss anemia-due to above. Hemoglobin as improved s/p 2 units PRBCs. -Serial H/H  Hypokalemia -Replete and recheck  Hypertension- BPs have been low-normal. -Holding home hydralazine, can restart as needed  Type 2 diabetes -Continue SSI -A1c pending  DVT prophylaxis- SCDs  All the records are reviewed and case  discussed with Care Management/Social Worker. Management plans discussed  with the patient, family and they are in agreement.  CODE STATUS: Full Code  TOTAL TIME TAKING CARE OF THIS PATIENT: 40 minutes.   More than 50% of the time was spent in counseling/coordination of care: YES  POSSIBLE D/C IN 1-2 DAYS, DEPENDING ON CLINICAL CONDITION.   Berna Spare Ying Blankenhorn M.D on 09/09/2019 at 9:35 AM  Between 7am to 6pm - Pager [phone]  After 6pm go to [email or web address] - Proofreader  [facility]  Office  [phone]  CC: Primary care physician; System, Pcp Not In  Note: This dictation was prepared with Dragon dictation along with smaller phrase technology. Any transcriptional errors that result from this process are unintentional.

## 2019-09-10 ENCOUNTER — Encounter: Payer: Self-pay | Admitting: Anesthesiology

## 2019-09-10 ENCOUNTER — Ambulatory Visit: Payer: Medicare Other

## 2019-09-10 DIAGNOSIS — K921 Melena: Secondary | ICD-10-CM

## 2019-09-10 LAB — CBC
HCT: 24.9 % — ABNORMAL LOW (ref 36.0–46.0)
Hemoglobin: 8.2 g/dL — ABNORMAL LOW (ref 12.0–15.0)
MCH: 28 pg (ref 26.0–34.0)
MCHC: 32.9 g/dL (ref 30.0–36.0)
MCV: 85 fL (ref 80.0–100.0)
Platelets: 152 10*3/uL (ref 150–400)
RBC: 2.93 MIL/uL — ABNORMAL LOW (ref 3.87–5.11)
RDW: 13.7 % (ref 11.5–15.5)
WBC: 7.6 10*3/uL (ref 4.0–10.5)
nRBC: 0 % (ref 0.0–0.2)

## 2019-09-10 LAB — BPAM RBC
Blood Product Expiration Date: 202010142359
Blood Product Expiration Date: 202010162359
ISSUE DATE / TIME: 202009191944
ISSUE DATE / TIME: 202009200410
Unit Type and Rh: 7300
Unit Type and Rh: 7300

## 2019-09-10 LAB — BASIC METABOLIC PANEL
Anion gap: 6 (ref 5–15)
BUN: 8 mg/dL (ref 8–23)
CO2: 23 mmol/L (ref 22–32)
Calcium: 8.3 mg/dL — ABNORMAL LOW (ref 8.9–10.3)
Chloride: 112 mmol/L — ABNORMAL HIGH (ref 98–111)
Creatinine, Ser: 0.79 mg/dL (ref 0.44–1.00)
GFR calc Af Amer: >60 mL/min (ref >60)
GFR calc non Af Amer: >60 mL/min (ref >60)
Glucose, Bld: 154 mg/dL — ABNORMAL HIGH (ref 70–99)
Potassium: 3.3 mmol/L — ABNORMAL LOW (ref 3.5–5.1)
Sodium: 141 mmol/L (ref 135–145)

## 2019-09-10 LAB — TYPE AND SCREEN
ABO/RH(D): B POS
Antibody Screen: NEGATIVE
Unit division: 0
Unit division: 0

## 2019-09-10 LAB — HEMOGLOBIN AND HEMATOCRIT, BLOOD
HCT: 26.6 % — ABNORMAL LOW (ref 36.0–46.0)
Hemoglobin: 8.6 g/dL — ABNORMAL LOW (ref 12.0–15.0)

## 2019-09-10 LAB — GLUCOSE, CAPILLARY
Glucose-Capillary: 129 mg/dL — ABNORMAL HIGH (ref 70–99)
Glucose-Capillary: 139 mg/dL — ABNORMAL HIGH (ref 70–99)
Glucose-Capillary: 153 mg/dL — ABNORMAL HIGH (ref 70–99)
Glucose-Capillary: 111 mg/dL — ABNORMAL HIGH (ref 70–99)

## 2019-09-10 LAB — VITAMIN B12: Vitamin B-12: 222 pg/mL (ref 180–914)

## 2019-09-10 LAB — FOLATE: Folate: 14.8 ng/mL (ref >5.9)

## 2019-09-10 MED ORDER — PANTOPRAZOLE SODIUM 40 MG PO TBEC
40.0000 mg | DELAYED_RELEASE_TABLET | Freq: Every day | ORAL | Status: DC
  Administered 2019-09-10 – 2019-09-12 (×3): 40 mg via ORAL
  Filled 2019-09-10 (×3): qty 1

## 2019-09-10 MED ORDER — POTASSIUM CHLORIDE CRYS ER 20 MEQ PO TBCR
40.0000 meq | EXTENDED_RELEASE_TABLET | Freq: Once | ORAL | Status: AC
  Administered 2019-09-10: 40 meq via ORAL
  Filled 2019-09-10: qty 2

## 2019-09-10 MED ORDER — LIDOCAINE HCL (PF) 2 % IJ SOLN
INTRAMUSCULAR | Status: AC
  Filled 2019-09-10: qty 10

## 2019-09-10 MED ORDER — HYDRALAZINE HCL 25 MG PO TABS
25.0000 mg | ORAL_TABLET | Freq: Three times a day (TID) | ORAL | Status: DC
  Administered 2019-09-10 – 2019-09-12 (×5): 25 mg via ORAL
  Filled 2019-09-10 (×5): qty 1

## 2019-09-10 MED ORDER — PROPOFOL 500 MG/50ML IV EMUL
INTRAVENOUS | Status: AC
  Filled 2019-09-10: qty 50

## 2019-09-10 MED ORDER — BISACODYL 5 MG PO TBEC
10.0000 mg | DELAYED_RELEASE_TABLET | Freq: Once | ORAL | Status: AC
  Administered 2019-09-11: 10 mg via ORAL
  Filled 2019-09-10: qty 2

## 2019-09-10 NOTE — Progress Notes (Signed)
Jessica Antigua, MD [address] [place], [place], [place], [address] [address], [place], [place], [place], [address] Phone: [phone]  Fax: [phone]   Subjective: Patient did not complete her prep.  Output today consists of solid stool with blood.  Hemoglobin stable   Objective: Exam: Vital signs in last 24 hours: Vitals:   09/09/19 2057 09/10/19 0633 09/10/19 0633 09/10/19 1253  BP: (!) 164/93 (!) 151/81 (!) 151/81 (!) 148/75  Pulse: 86 88 88 84  Resp: 19 18 18 16   Temp: 98.8 F (37.1 C) 98.8 F (37.1 C) 98.8 F (37.1 C) 98.7 F (37.1 C)  TempSrc: Oral Oral Oral Oral  SpO2: 99%  95% 96%  Weight:      Height:       Weight change:   Intake/Output Summary (Last 24 hours) at 09/10/2019 1451 Last data filed at 09/10/2019 [id number] Gross per 24 hour  Intake 1758.5 ml  Output -  Net 1758.5 ml    General: No acute distress, AAO x3 Abd: Soft, NT/ND, No HSM Skin: Warm, no rashes Neck: Supple, Trachea midline   Lab Results: Lab Results  Component Value Date   WBC 7.6 09/10/2019   HGB 8.2 (L) 09/10/2019   HCT 24.9 (L) 09/10/2019   MCV 85.0 09/10/2019   PLT 152 09/10/2019   Micro Results: Recent Results (from the past 240 hour(s))  SARS CORONAVIRUS 2 (TAT 6-24 HRS) Nasopharyngeal Nasopharyngeal Swab     Status: None   Collection Time: 09/08/19  7:04 PM   Specimen: Nasopharyngeal Swab  Result Value Ref Range Status   SARS Coronavirus 2 NEGATIVE NEGATIVE Final    Comment: (NOTE) SARS-CoV-2 target nucleic acids are NOT DETECTED. The SARS-CoV-2 RNA is generally detectable in upper and lower respiratory specimens during the acute phase of infection. Negative results do not preclude SARS-CoV-2 infection, do not rule out co-infections with other pathogens, and should not be used as the sole basis for treatment or other patient management decisions. Negative results must be combined with clinical observations, patient history, and epidemiological  information. The expected result is Negative. Fact Sheet for Patients: SugarRoll.be Fact Sheet for Healthcare Providers: https://www.woods-mathews.com/ This test is not yet approved or cleared by the [place] FDA and  has been authorized for detection and/or diagnosis of SARS-CoV-2 by FDA under an Emergency Use Authorization (EUA). This EUA will remain  in effect (meaning this test can be used) for the duration of the COVID-19 declaration under Section 56 4(b)(1) of the Act, 21 U.S.C. section 360bbb-3(b)(1), unless the authorization is terminated or revoked sooner. Performed at [facility], [place] [address] [place], [place] [address]    Studies/Results: Nm Gi Blood Loss  Result Date: 09/09/2019 CLINICAL DATA:  GI bleeding EXAM: NUCLEAR MEDICINE GASTROINTESTINAL BLEEDING SCAN TECHNIQUE: Sequential abdominal images were obtained following intravenous administration of Tc-82m labeled red blood cells. RADIOPHARMACEUTICALS:  20.76 mCi Tc-92m pertechnetate in-vitro labeled red cells. COMPARISON:  None. FINDINGS: No progressive, non static foci of activity are visualized over the abdomen or pelvis. Progressive bladder activity is noted. IMPRESSION: Negative.  No acute GI bleeding observed on the current exam. Electronically Signed   By: Donavan Foil M.D.   On: 09/09/2019 02:44   Medications:  Scheduled Meds: . hydrALAZINE  25 mg Oral Q8H  . insulin aspart  0-5 Units Subcutaneous QHS  . insulin aspart  0-9 Units Subcutaneous TID WC  . pantoprazole  40 mg Oral Daily   Continuous Infusions: . sodium chloride 100  mL/hr at 09/10/19 0834  . sodium chloride Stopped (09/09/19 1225)   PRN Meds:.sodium chloride   Assessment: Active Problems:   GI bleed    Plan: We will plan on colonoscopy once patient finishes the prep, likely tomorrow PPI IV twice daily  Continue serial CBCs and transfuse PRN Avoid NSAIDs Maintain 2 large-bore IV  lines Please page GI with any acute hemodynamic changes, or signs of active GI bleeding  I have discussed alternative options, risks & benefits,  which include, but are not limited to, bleeding, infection, perforation,respiratory complication & drug reaction.  The patient agrees with this plan & written consent will be obtained.       LOS: 2 days   Jessica Antigua, MD 09/10/2019, 2:51 PM

## 2019-09-10 NOTE — Progress Notes (Signed)
[place] at [place] NAME: Jessica [name]    MR#:  [id number]  DATE OF BIRTH:  Jan 23, 1951  SUBJECTIVE:  CHIEF COMPLAINT:   Chief Complaint  Patient presents with  . Rectal Bleeding   -Hemoglobin is stable this morning.  Could not finish colon prep yet.  Continues to have bloody bowel movements which cleared by this afternoon.  For colonoscopy today  REVIEW OF SYSTEMS:  Review of Systems  Constitutional: Negative for chills, fever and malaise/fatigue.  HENT: Negative for congestion, ear discharge, hearing loss and nosebleeds.   Eyes: Negative for blurred vision and double vision.  Respiratory: Negative for cough, shortness of breath and wheezing.   Cardiovascular: Negative for chest pain and palpitations.  Gastrointestinal: Positive for blood in stool. Negative for abdominal pain, constipation, diarrhea, nausea and vomiting.  Genitourinary: Negative for dysuria.  Musculoskeletal: Negative for myalgias.  Neurological: Negative for dizziness, focal weakness, seizures, weakness and headaches.  Psychiatric/Behavioral: Negative for depression.    DRUG ALLERGIES:  No Known Allergies  VITALS:  Blood pressure (!) 151/81, pulse 88, temperature 98.8 F (37.1 C), temperature source Oral, resp. rate 18, height 5\' 9"  (1.753 m), weight 81.6 kg, SpO2 95 %.  PHYSICAL EXAMINATION:  Physical Exam   GENERAL:  68 y.o.-year-old patient sitting in the bed with no acute distress.  EYES: Pupils equal, round, reactive to light and accommodation. No scleral icterus. Extraocular muscles intact.  HEENT: Head atraumatic, normocephalic. Oropharynx and nasopharynx clear.  NECK:  Supple, no jugular venous distention. No thyroid enlargement, no tenderness.  LUNGS: Normal breath sounds bilaterally, no wheezing, rales,rhonchi or crepitation. No use of accessory muscles of respiration.  CARDIOVASCULAR: S1, S2 normal. No murmurs, rubs, or gallops.  ABDOMEN: Soft,  nontender, nondistended. Bowel sounds present. No organomegaly or mass.  EXTREMITIES: No pedal edema, cyanosis, or clubbing.  NEUROLOGIC: Cranial nerves II through XII are intact. Muscle strength 5/5 in all extremities. Sensation intact. Gait not checked.  PSYCHIATRIC: The patient is alert and oriented x 3.  SKIN: No obvious rash, lesion, or ulcer.    LABORATORY PANEL:   CBC Recent Labs  Lab 09/10/19 0446  WBC 7.6  HGB 8.2*  HCT 24.9*  PLT 152   ------------------------------------------------------------------------------------------------------------------  Chemistries  Recent Labs  Lab 09/08/19 1813 09/09/19 0905 09/10/19 0446  NA 137 141 141  K 3.8 3.3* 3.3*  CL 107 112* 112*  CO2 18* 22 23  GLUCOSE 316* 168* 154*  BUN 15 13 8   CREATININE 0.86 0.85 0.79  CALCIUM 8.5* 8.2* 8.3*  MG  --  1.9  --   AST 19  --   --   ALT 12  --   --   ALKPHOS 58  --   --   BILITOT 0.7  --   --    ------------------------------------------------------------------------------------------------------------------  Cardiac Enzymes No results for input(s): TROPONINI in the last 168 hours. ------------------------------------------------------------------------------------------------------------------  RADIOLOGY:  Nm Gi Blood Loss  Result Date: 09/09/2019 CLINICAL DATA:  GI bleeding EXAM: NUCLEAR MEDICINE GASTROINTESTINAL BLEEDING SCAN TECHNIQUE: Sequential abdominal images were obtained following intravenous administration of Tc-65m labeled red blood cells. RADIOPHARMACEUTICALS:  20.76 mCi Tc-69m pertechnetate in-vitro labeled red cells. COMPARISON:  None. FINDINGS: No progressive, non static foci of activity are visualized over the abdomen or pelvis. Progressive bladder activity is noted. IMPRESSION: Negative.  No acute GI bleeding observed on the current exam. Electronically Signed   By: Donavan Foil M.D.   On: 09/09/2019 02:44  EKG:   Orders placed or performed during the  hospital encounter of 08/08/19  . EKG 12-Lead  . EKG 12-Lead  . EKG    ASSESSMENT AND PLAN:   68 year old female with past medical history significant for diabetes mellitus presented to hospital secondary to rectal bleed  1.  Lower GI bleed-likely diverticular bleed.  Nuclear medicine bleeding scan is negative -Patient did receive 2 units of packed RBC transfusion as hemoglobin dropped to 6.7. -Hemoglobin is stable at this time.  Active bleeding has stopped. -Appreciate GI consult. -For colonoscopy today if adequate prep done. -Continue to monitor for 1 more day  2.  GERD-changed to oral Protonix  3.  Acute blood loss anemia-secondary to GI bleed.  Received blood transfusion 2 units this admission.  Hemoglobin is stable at this time.  Recheck in a.m.  4.  Hypokalemia-being replaced  5.  Hypertension-blood pressure have been stable.  Not on any medication  6.  Diabetes-on sliding scale insulin  7.  DVT prophylaxis-teds and SCDs  Independent at baseline   All the records are reviewed and case discussed with Care Management/Social Workerr. Management plans discussed with the patient, family and they are in agreement.  CODE STATUS: Full Code  TOTAL TIME TAKING CARE OF THIS PATIENT: 38 minutes.   POSSIBLE D/C IN 1-2 DAYS, DEPENDING ON CLINICAL CONDITION.   Gladstone Lighter M.D on 09/10/2019 at 12:50 PM  Between 7am to 6pm - Pager - [phone]  After 6pm go to [email or web address] - password [email or web address] [place]  Office  [phone]  CC: Primary care physician; System, Pcp Not In

## 2019-09-11 ENCOUNTER — Encounter
Admission: EM | Disposition: A | Discharge status: Home / Self Care | Payer: Self-pay | Source: Home / Self Care | Attending: Internal Medicine

## 2019-09-11 ENCOUNTER — Inpatient Hospital Stay: Payer: Medicare Other | Admitting: Certified Registered Nurse Anesthetist

## 2019-09-11 ENCOUNTER — Ambulatory Visit: Admit: 2019-09-11 | Payer: BLUE CROSS/BLUE SHIELD | Admitting: Gastroenterology

## 2019-09-11 DIAGNOSIS — K5730 Diverticulosis of large intestine without perforation or abscess without bleeding: Secondary | ICD-10-CM

## 2019-09-11 DIAGNOSIS — D62 Acute posthemorrhagic anemia: Secondary | ICD-10-CM

## 2019-09-11 DIAGNOSIS — K635 Polyp of colon: Secondary | ICD-10-CM

## 2019-09-11 HISTORY — PX: COLONOSCOPY: SHX5424

## 2019-09-11 LAB — GLUCOSE, CAPILLARY
Glucose-Capillary: 146 mg/dL — ABNORMAL HIGH (ref 70–99)
Glucose-Capillary: 146 mg/dL — ABNORMAL HIGH (ref 70–99)
Glucose-Capillary: 245 mg/dL — ABNORMAL HIGH (ref 70–99)
Glucose-Capillary: 148 mg/dL — ABNORMAL HIGH (ref 70–99)

## 2019-09-11 LAB — BASIC METABOLIC PANEL
Anion gap: 5 (ref 5–15)
BUN: <5 mg/dL — ABNORMAL LOW (ref 8–23)
CO2: 23 mmol/L (ref 22–32)
Calcium: 8.5 mg/dL — ABNORMAL LOW (ref 8.9–10.3)
Chloride: 114 mmol/L — ABNORMAL HIGH (ref 98–111)
Creatinine, Ser: 0.78 mg/dL (ref 0.44–1.00)
GFR calc Af Amer: >60 mL/min (ref >60)
GFR calc non Af Amer: >60 mL/min (ref >60)
Glucose, Bld: 121 mg/dL — ABNORMAL HIGH (ref 70–99)
Potassium: 3.1 mmol/L — ABNORMAL LOW (ref 3.5–5.1)
Sodium: 142 mmol/L (ref 135–145)

## 2019-09-11 LAB — CBC
HCT: 24.8 % — ABNORMAL LOW (ref 36.0–46.0)
HCT: 29.9 % — ABNORMAL LOW (ref 36.0–46.0)
Hemoglobin: 8 g/dL — ABNORMAL LOW (ref 12.0–15.0)
Hemoglobin: 9.7 g/dL — ABNORMAL LOW (ref 12.0–15.0)
MCH: 27.7 pg (ref 26.0–34.0)
MCH: 28 pg (ref 26.0–34.0)
MCHC: 32.3 g/dL (ref 30.0–36.0)
MCHC: 32.4 g/dL (ref 30.0–36.0)
MCV: 85.8 fL (ref 80.0–100.0)
MCV: 86.2 fL (ref 80.0–100.0)
Platelets: 179 10*3/uL (ref 150–400)
Platelets: 226 10*3/uL (ref 150–400)
RBC: 2.89 MIL/uL — ABNORMAL LOW (ref 3.87–5.11)
RBC: 3.47 MIL/uL — ABNORMAL LOW (ref 3.87–5.11)
RDW: 13.8 % (ref 11.5–15.5)
RDW: 13.9 % (ref 11.5–15.5)
WBC: 7.1 10*3/uL (ref 4.0–10.5)
WBC: 9 10*3/uL (ref 4.0–10.5)
nRBC: 0 % (ref 0.0–0.2)
nRBC: 0 % (ref 0.0–0.2)

## 2019-09-11 SURGERY — ESOPHAGOGASTRODUODENOSCOPY (EGD) WITH PROPOFOL
Anesthesia: General

## 2019-09-11 SURGERY — COLONOSCOPY
Anesthesia: General

## 2019-09-11 MED ORDER — LIDOCAINE HCL (CARDIAC) PF 100 MG/5ML IV SOSY
PREFILLED_SYRINGE | INTRAVENOUS | Status: DC | PRN
  Administered 2019-09-11: 100 mg via INTRATRACHEAL

## 2019-09-11 MED ORDER — SPOT INK MARKER SYRINGE KIT
PACK | SUBMUCOSAL | Status: DC | PRN
  Administered 2019-09-11: 2 mL via SUBMUCOSAL

## 2019-09-11 MED ORDER — POTASSIUM CHLORIDE 10 MEQ/100ML IV SOLN
10.0000 meq | INTRAVENOUS | Status: AC
  Administered 2019-09-11 (×3): 10 meq via INTRAVENOUS
  Filled 2019-09-11 (×2): qty 100

## 2019-09-11 MED ORDER — PHENYLEPHRINE HCL (PRESSORS) 10 MG/ML IV SOLN
INTRAVENOUS | Status: DC | PRN
  Administered 2019-09-11: 50 ug via INTRAVENOUS
  Administered 2019-09-11: 100 ug via INTRAVENOUS

## 2019-09-11 MED ORDER — GLYCOPYRROLATE 0.2 MG/ML IJ SOLN
INTRAMUSCULAR | Status: DC | PRN
  Administered 2019-09-11: 0.2 mg via INTRAVENOUS

## 2019-09-11 MED ORDER — PROPOFOL 500 MG/50ML IV EMUL
INTRAVENOUS | Status: DC | PRN
  Administered 2019-09-11: 145 ug/kg/min via INTRAVENOUS

## 2019-09-11 MED ORDER — PROPOFOL 10 MG/ML IV BOLUS
INTRAVENOUS | Status: DC | PRN
  Administered 2019-09-11 (×3): 20 mg via INTRAVENOUS
  Administered 2019-09-11: 50 mg via INTRAVENOUS
  Administered 2019-09-11 (×5): 20 mg via INTRAVENOUS

## 2019-09-11 NOTE — Op Note (Signed)
[facility] Patient Name: Alleena Gandolfo Procedure Date: 09/11/2019 11:46 AM MRN: [id number] Account #: [id number] Date of Birth: 01-03-1951 Admit Type: Inpatient Age: 68 Room: [facility] ENDO ROOM 3 Gender: Female Note Status: Finalized Procedure:            Colonoscopy Indications:          Hematochezia Providers:            Ethal Gotay B. Bonna Gains MD, MD Medicines:            Monitored Anesthesia Care Complications:        No immediate complications. Procedure:            Pre-Anesthesia Assessment:                       - ASA Grade Assessment: II - A patient with mild                        systemic disease.                       - Prior to the procedure, a History and Physical was                        performed, and patient medications, allergies and                        sensitivities were reviewed. The patient's tolerance of                        previous anesthesia was reviewed.                       - The risks and benefits of the procedure and the                        sedation options and risks were discussed with the                        patient. All questions were answered and informed                        consent was obtained.                       - Patient identification and proposed procedure were                        verified prior to the procedure by the physician, the                        nurse, the anesthesiologist, the anesthetist and the                        technician. The procedure was verified in the procedure                        room.                       After obtaining informed consent, the colonoscope was  passed under direct vision. Throughout the procedure,                        the patient's blood pressure, pulse, and oxygen                        saturations were monitored continuously. The                        Colonoscope was introduced through the anus and   advanced to the the cecum, identified by appendiceal                        orifice and ileocecal valve. The colonoscopy was                        performed with ease. The patient tolerated the                        procedure well. The quality of the bowel preparation                        was fair. Findings:      The perianal and digital rectal examinations were normal.      A 12 mm polyp was found in the transverse colon. The polyp was flat.       Area was successfully injected with Eleview for lesion assessment, and       this injection appeared to lift the lesion adequately. The polyp was       removed with a hot snare. Resection and retrieval were complete. To       prevent bleeding after the polypectomy, three hemostatic clips were       successfully placed. There was no bleeding at the end of the procedure.       Area was tattooed with an injection of Spot (carbon black).      Multiple diverticula were found in the sigmoid colon, transverse colon       and ascending colon.      The exam was otherwise without abnormality.      The rectum, sigmoid colon, descending colon, transverse colon, ascending       colon and cecum appeared normal.      The retroflexed view of the distal rectum and anal verge was normal and       showed no anal or rectal abnormalities. Impression:           - One 12 mm polyp in the transverse colon, removed with                        a hot snare. Resected and retrieved. Injected. Clips                        were placed. Tattoo placed                       - Diverticulosis in the sigmoid colon.                       - The examination was otherwise normal.                       -  The rectum, sigmoid colon, descending colon,                        transverse colon, ascending colon and cecum are normal.                       - The distal rectum and anal verge are normal on                        retroflexion view.                       - No evidence of  active or recent bleeding seen                        throughout the exam.                       - The absence of black or red stool and the presence of                        yellow stool signifies resolution of bleeding. With                        stable hemoglobin, no signs of active GI bleeding, and                        no evidence of old or recent bleeding the colon also                        suggests that there is no upper GI bleeding occuring                        either. Recommendation:       - Advance diet as tolerated.                       - Continue present medications.                       - Await pathology results.                       - Repeat colonoscopy date to be determined after                        pending pathology results are reviewed.                       - The findings and recommendations were discussed with                        the patient.                       - Return to primary care physician as previously                        scheduled.                       - High fiber diet.                       -  Continue Serial CBCs and transfuse PRN                       - Return to GI clinic in 2 weeks. Procedure Code(s):    --- Professional ---                       [phone], Colonoscopy, flexible; with removal of tumor(s),                        polyp(s), or other lesion(s) by snare technique                       [id number], Colonoscopy, flexible; with directed submucosal                        injection(s), any substance Diagnosis Code(s):    --- Professional ---                       K63.5, Polyp of colon                       K92.1, Melena (includes Hematochezia)                       K57.30, Diverticulosis of large intestine without                        perforation or abscess without bleeding CPT copyright 2019 American Medical Association. All rights reserved. The codes documented in this report are preliminary and upon coder review may  be revised  to meet current compliance requirements.  Vonda Antigua, MD Margretta Sidle B. Bonna Gains MD, MD 09/11/2019 2:04:11 PM This report has been signed electronically. Number of Addenda: 0 Note Initiated On: 09/11/2019 11:46 AM Scope Withdrawal Time: 0 hours 31 minutes 58 seconds  Total Procedure Duration: 0 hours 42 minutes 58 seconds  Estimated Blood Loss: Estimated blood loss: none.      [facility]

## 2019-09-11 NOTE — Progress Notes (Signed)
[facility] [place] NAME: Jessica [name]    MR#:  [id number]  DATE OF BIRTH:  04/29/1951  SUBJECTIVE:  CHIEF COMPLAINT:   Chief Complaint  Patient presents with  . Rectal Bleeding   - hb stable, no further bleeding - for colonoscopy today  REVIEW OF SYSTEMS:  Review of Systems  Constitutional: Negative for chills, fever and malaise/fatigue.  HENT: Negative for congestion, ear discharge, hearing loss and nosebleeds.   Eyes: Negative for blurred vision and double vision.  Respiratory: Negative for cough, shortness of breath and wheezing.   Cardiovascular: Negative for chest pain and palpitations.  Gastrointestinal: Positive for blood in stool. Negative for abdominal pain, constipation, diarrhea, nausea and vomiting.  Genitourinary: Negative for dysuria.  Musculoskeletal: Negative for myalgias.  Neurological: Negative for dizziness, focal weakness, seizures, weakness and headaches.  Psychiatric/Behavioral: Negative for depression.    DRUG ALLERGIES:  No Known Allergies  VITALS:  Blood pressure 130/68, pulse 88, temperature 98.8 F (37.1 C), temperature source Oral, resp. rate 20, height 5\' 9"  (1.753 m), weight 81.6 kg, SpO2 94 %.  PHYSICAL EXAMINATION:  Physical Exam   GENERAL:  68 y.o.-year-old patient sitting in the bed with no acute distress.  EYES: Pupils equal, round, reactive to light and accommodation. No scleral icterus. Extraocular muscles intact.  HEENT: Head atraumatic, normocephalic. Oropharynx and nasopharynx clear.  NECK:  Supple, no jugular venous distention. No thyroid enlargement, no tenderness.  LUNGS: Normal breath sounds bilaterally, no wheezing, rales,rhonchi or crepitation. No use of accessory muscles of respiration.  CARDIOVASCULAR: S1, S2 normal. No murmurs, rubs, or gallops.  ABDOMEN: Soft, nontender, nondistended. Bowel sounds present. No organomegaly or mass.  EXTREMITIES: No pedal edema, cyanosis, or  clubbing.  NEUROLOGIC: Cranial nerves II through XII are intact. Muscle strength 5/5 in all extremities. Sensation intact. Gait not checked.  PSYCHIATRIC: The patient is alert and oriented x 3.  SKIN: No obvious rash, lesion, or ulcer.    LABORATORY PANEL:   CBC Recent Labs  Lab 09/11/19 0506  WBC 7.1  HGB 8.0*  HCT 24.8*  PLT 179   ------------------------------------------------------------------------------------------------------------------  Chemistries  Recent Labs  Lab 09/08/19 1813 09/09/19 0905  09/11/19 0506  NA 137 141   < > 142  K 3.8 3.3*   < > 3.1*  CL 107 112*   < > 114*  CO2 18* 22   < > 23  GLUCOSE 316* 168*   < > 121*  BUN 15 13   < > <5*  CREATININE 0.86 0.85   < > 0.78  CALCIUM 8.5* 8.2*   < > 8.5*  MG  --  1.9  --   --   AST 19  --   --   --   ALT 12  --   --   --   ALKPHOS 58  --   --   --   BILITOT 0.7  --   --   --    < > = values in this interval not displayed.   ------------------------------------------------------------------------------------------------------------------  Cardiac Enzymes No results for input(s): TROPONINI in the last 168 hours. ------------------------------------------------------------------------------------------------------------------  RADIOLOGY:  No results found.  EKG:   Orders placed or performed during the hospital encounter of 08/08/19  . EKG 12-Lead  . EKG 12-Lead  . EKG    ASSESSMENT AND PLAN:   68 year old female with past medical history significant for diabetes mellitus presented to hospital secondary to rectal bleed  1.  Lower GI bleed-likely diverticular bleed.  Nuclear medicine bleeding scan is negative -Patient did receive 2 units of packed RBC transfusion as hemoglobin dropped to 6.7. -Hemoglobin is stable at this time at around 8.  Active bleeding has stopped. -Appreciate GI consult. -For colonoscopy today . - on clears yesterday  2.  GERD-changed to oral Protonix  3.  Acute  blood loss anemia-secondary to GI bleed.  Received blood transfusion 2 units this admission.  Hemoglobin is stable at this time.  .  4.  Hypokalemia- replaced  5.  Hypertension-blood pressure have been stable.  Not on any medication  6.  Diabetes-on sliding scale insulin  7.  DVT prophylaxis-teds and SCDs  Independent at baseline Anticipate discharge today   All the records are reviewed and case discussed with Care Management/Social Workerr. Management plans discussed with the patient, family and they are in agreement.  CODE STATUS: Full Code  TOTAL TIME TAKING CARE OF THIS PATIENT: 39 minutes.   POSSIBLE D/C TODAY OR TOMORROW, DEPENDING ON CLINICAL CONDITION.   Gladstone Lighter M.D on 09/11/2019 at 8:37 AM  Between 7am to 6pm - Pager - [phone]  After 6pm go to [email or web address] - password [email or web address] [facility]  Office  [phone]  CC: Primary care physician; System, Pcp Not In

## 2019-09-11 NOTE — Anesthesia Preprocedure Evaluation (Signed)
Anesthesia Evaluation  Patient identified by MRN, date of birth, ID band Patient awake    Reviewed: Allergy & Precautions, NPO status , Patient's Chart, lab work & pertinent test results  History of Anesthesia Complications Negative for: history of anesthetic complications  Airway Mallampati: II  TM Distance: >3 FB Neck ROM: Full    Dental no notable dental hx.    Pulmonary neg pulmonary ROS, neg sleep apnea, neg COPD,    breath sounds clear to auscultation- rhonchi (-) wheezing      Cardiovascular Exercise Tolerance: Good (-) hypertension(-) CAD, (-) Past MI, (-) Cardiac Stents and (-) CABG  Rhythm:Regular Rate:Normal - Systolic murmurs and - Diastolic murmurs    Neuro/Psych neg Seizures negative neurological ROS  negative psych ROS   GI/Hepatic Neg liver ROS, GIB   Endo/Other  diabetes, Insulin Dependent  Renal/GU negative Renal ROS     Musculoskeletal negative musculoskeletal ROS (+)   Abdominal (+) - obese,   Peds  Hematology negative hematology ROS (+)   Anesthesia Other Findings Past Medical History: No date: Diabetes mellitus without complication ([place]) [id number]: GI bleed   Reproductive/Obstetrics                             Anesthesia Physical Anesthesia Plan  ASA: II  Anesthesia Plan: General   Post-op Pain Management:    Induction: Intravenous  PONV Risk Score and Plan: 2 and Propofol infusion  Airway Management Planned: Natural Airway  Additional Equipment:   Intra-op Plan:   Post-operative Plan:   Informed Consent: I have reviewed the patients History and Physical, chart, labs and discussed the procedure including the risks, benefits and alternatives for the proposed anesthesia with the patient or authorized representative who has indicated his/her understanding and acceptance.     Dental advisory given  Plan Discussed with: CRNA and  Anesthesiologist  Anesthesia Plan Comments:         Anesthesia Quick Evaluation

## 2019-09-11 NOTE — Transfer of Care (Signed)
Immediate Anesthesia Transfer of Care Note  Patient: Jessica [name]  Procedure(s) Performed: COLONOSCOPY (N/A )  Patient Location: PACU  Anesthesia Type:General  Level of Consciousness: sedated  Airway & Oxygen Therapy: Patient Spontanous Breathing and Patient connected to face mask oxygen  Post-op Assessment: Report given to RN and Post -op Vital signs reviewed and stable  Post vital signs: Reviewed and stable  Last Vitals:  Vitals Value Taken Time  BP 113/70 09/11/19 1359  Temp 36.7 C 09/11/19 1359  Pulse 118 09/11/19 1401  Resp 21 09/11/19 1401  SpO2 100 % 09/11/19 1401  Vitals shown include unvalidated device data.  Last Pain:  Vitals:   09/11/19 1359  TempSrc:   PainSc: Asleep         Complications: No apparent anesthesia complications

## 2019-09-11 NOTE — Anesthesia Post-op Follow-up Note (Signed)
Anesthesia QCDR form completed.        

## 2019-09-11 NOTE — Progress Notes (Signed)
Jessica Antigua, MD [address], [place], [place], [place], [address] [address], [place], [place], [place], [address] Phone: [phone]  Fax: [phone]   Subjective: Patient has completed her prep.  Hemoglobin stable.  No hematemesis. No further hematochezia.    Objective: Exam: Vital signs in last 24 hours: Vitals:   09/10/19 0633 09/10/19 1253 09/10/19 2021 09/11/19 0512  BP: (!) 151/81 (!) 148/75 (!) 167/87 130/68  Pulse: 88 84 93 88  Resp: 18 16 20 20   Temp: 98.8 F (37.1 C) 98.7 F (37.1 C) 99.8 F (37.7 C) 98.8 F (37.1 C)  TempSrc: Oral Oral Oral Oral  SpO2: 95% 96% 98% 94%  Weight:      Height:       Weight change:  No intake or output data in the 24 hours ending 09/11/19 1029  General: No acute distress, AAO x3 Abd: Soft, NT/ND, No HSM Skin: Warm, no rashes Neck: Supple, Trachea midline   Lab Results: Lab Results  Component Value Date   WBC 7.1 09/11/2019   HGB 8.0 (L) 09/11/2019   HCT 24.8 (L) 09/11/2019   MCV 85.8 09/11/2019   PLT 179 09/11/2019   Micro Results: Recent Results (from the past 240 hour(s))  SARS CORONAVIRUS 2 (TAT 6-24 HRS) Nasopharyngeal Nasopharyngeal Swab     Status: None   Collection Time: 09/08/19  7:04 PM   Specimen: Nasopharyngeal Swab  Result Value Ref Range Status   SARS Coronavirus 2 NEGATIVE NEGATIVE Final    Comment: (NOTE) SARS-CoV-2 target nucleic acids are NOT DETECTED. The SARS-CoV-2 RNA is generally detectable in upper and lower respiratory specimens during the acute phase of infection. Negative results do not preclude SARS-CoV-2 infection, do not rule out co-infections with other pathogens, and should not be used as the sole basis for treatment or other patient management decisions. Negative results must be combined with clinical observations, patient history, and epidemiological information. The expected result is Negative. Fact Sheet for Patients: SugarRoll.be  Fact Sheet for Healthcare Providers: https://www.woods-mathews.com/ This test is not yet approved or cleared by the [place] FDA and  has been authorized for detection and/or diagnosis of SARS-CoV-2 by FDA under an Emergency Use Authorization (EUA). This EUA will remain  in effect (meaning this test can be used) for the duration of the COVID-19 declaration under Section 56 4(b)(1) of the Act, 21 U.S.C. section 360bbb-3(b)(1), unless the authorization is terminated or revoked sooner. Performed at [facility], [place] [address], [place], [place] [address]    Studies/Results: No results found. Medications:  Scheduled Meds: . hydrALAZINE  25 mg Oral Q8H  . insulin aspart  0-5 Units Subcutaneous QHS  . insulin aspart  0-9 Units Subcutaneous TID WC  . pantoprazole  40 mg Oral Daily   Continuous Infusions: . sodium chloride 100 mL/hr at 09/11/19 0606  . sodium chloride Stopped (09/09/19 1225)  . potassium chloride 10 mEq (09/11/19 0953)   PRN Meds:.sodium chloride   Assessment: Active Problems:   GI bleed    Plan: We will plan on colonoscopy today for further evaluation of presenting symptoms that admission Continue serial CBCs and transfuse. Avoid NSAIDs  We will also consent her for an EGD prior to the colonoscopy.  If colonoscopy is negative and does not reveal any reason for her presenting symptoms, we may elect to do an EGD after the procedure  I have discussed alternative options, risks & benefits,  which include, but are not limited to, bleeding, infection, perforation,respiratory  complication & drug reaction.  The patient agrees with this plan & written consent will be obtained.      LOS: 3 days   Jessica Antigua, MD 09/11/2019, 10:29 AM

## 2019-09-11 NOTE — Care Management Important Message (Signed)
Important Message  Patient Details  Name: Maguire Meola MRN: [id number] Date of Birth: [birth year]   Medicare Important Message Given:  Yes  Initial Medicare IM given by Patient Access Associate on 09/10/2019 at 11:39am.  Still valid.   Dannette Barbara 09/11/2019, 11:41 AM

## 2019-09-12 ENCOUNTER — Encounter: Payer: Self-pay | Admitting: Gastroenterology

## 2019-09-12 ENCOUNTER — Telehealth: Payer: Self-pay

## 2019-09-12 LAB — SURGICAL PATHOLOGY

## 2019-09-12 LAB — GLUCOSE, CAPILLARY: Glucose-Capillary: 226 mg/dL — ABNORMAL HIGH (ref 70–99)

## 2019-09-12 LAB — HEMOGLOBIN AND HEMATOCRIT, BLOOD
HCT: 25.9 % — ABNORMAL LOW (ref 36.0–46.0)
Hemoglobin: 8.4 g/dL — ABNORMAL LOW (ref 12.0–15.0)

## 2019-09-12 MED ORDER — PANTOPRAZOLE SODIUM 40 MG PO TBEC: 40.0000 mg | DELAYED_RELEASE_TABLET | Freq: Every day | ORAL | 2 refills | Status: DC

## 2019-09-12 MED ORDER — FERROUS SULFATE 325 (65 FE) MG PO TABS: 325.0000 mg | ORAL_TABLET | Freq: Two times a day (BID) | ORAL | 2 refills | Status: DC

## 2019-09-12 MED ORDER — METOPROLOL TARTRATE 25 MG PO TABS: 25.0000 mg | ORAL_TABLET | Freq: Two times a day (BID) | ORAL | 2 refills | Status: DC

## 2019-09-12 NOTE — Telephone Encounter (Signed)
Called and left a message for call back  

## 2019-09-12 NOTE — Discharge Summary (Signed)
[facility] [place] NAME: Chyann Mensinger    MR#:  [id number]  DATE OF BIRTH:  [birth year]  DATE OF ADMISSION:  09/08/2019   ADMITTING PHYSICIAN: Otila Back, MD  DATE OF DISCHARGE:  09/12/19  PRIMARY CARE PHYSICIAN: System, Pcp Not In   ADMISSION DIAGNOSIS:   Acute blood loss anemia [D62] Gastrointestinal hemorrhage, unspecified gastrointestinal hemorrhage type [K92.2]  DISCHARGE DIAGNOSIS:   Active Problems:   GI bleed   SECONDARY DIAGNOSIS:   Past Medical History:  Diagnosis Date  . Diabetes mellitus without complication ([place])   . GI bleed 09/08/2019    HOSPITAL COURSE:   68 year old female with past medical history significant for diabetes mellitus presented to hospital secondary to rectal bleed  1.  Lower GI bleed-likely diverticular bleed.  Nuclear medicine bleeding scan is negative -Patient did receive 2 units of packed RBC transfusion as hemoglobin dropped to 6.7. -Hemoglobin is stable at this time at around 8.  Active bleeding has stopped. -Appreciate GI consult. -Status post colonoscopy that showed diverticulosis and also a sessile polyp that was snared and hemostatic clips were applied.  No active bleeding was noted.  Likely cause could have been diverticulosis. -No plans for EGD at this time.  Patient will be discharged on oral iron supplements and outpatient follow-up. -She is not on any blood thinners at home.  2.  GERD-started on Protonix in the hospital.  3.  Acute blood loss anemia-secondary to GI bleed.  Received blood transfusion 2 units this admission.  Hemoglobin is stable at this time.  .  4.  Hypokalemia- replaced  5.  Hypertension-blood pressure have been stable.    Will all started at discharge.  Patient also on hydralazine at home  6.  Diabetes-continue Trulicity, metformin and aspart insulin   Independent at baseline Discharge home today   DISCHARGE CONDITIONS:   Guarded  CONSULTS  OBTAINED:   Treatment Team:  Lin Landsman, MD  DRUG ALLERGIES:   No Known Allergies DISCHARGE MEDICATIONS:   Allergies as of 09/12/2019   No Known Allergies     Medication List    TAKE these medications   ferrous sulfate 325 (65 FE) MG tablet Take 1 tablet (325 mg total) by mouth 2 (two) times daily with a meal.   hydrALAZINE 25 MG tablet Commonly known as: APRESOLINE Take 1 tablet (25 mg total) by mouth 3 (three) times daily.   insulin aspart 100 UNIT/ML injection Commonly known as: novoLOG Inject 8 Units into the skin 3 (three) times daily before meals.   metFORMIN 500 MG tablet Commonly known as: GLUCOPHAGE Take 2 tablets (1,000 mg total) by mouth 2 (two) times daily with a meal.   metoprolol tartrate 25 MG tablet Commonly known as: LOPRESSOR Take 1 tablet (25 mg total) by mouth 2 (two) times daily.   nystatin-triamcinolone ointment Commonly known as: MYCOLOG Apply 1 application topically 2 (two) times daily. For vaginal candidiasis   pantoprazole 40 MG tablet Commonly known as: PROTONIX Take 1 tablet (40 mg total) by mouth daily.   Trulicity 1.5 0000000 Sopn Generic drug: Dulaglutide Inject 1 pen into the skin every 7 (seven) days.        DISCHARGE INSTRUCTIONS:   1.  CBC follow-up in 1 week 2.  PCP follow-up in 1 to 2 weeks 3.  GI follow-up as needed  DIET:   Cardiac diet  ACTIVITY:   Activity as tolerated  OXYGEN:   Home Oxygen: No.  Oxygen Delivery: room air  DISCHARGE LOCATION:   home   If you experience worsening of your admission symptoms, develop shortness of breath, life threatening emergency, suicidal or homicidal thoughts you must seek medical attention immediately by calling 911 or calling your MD immediately  if symptoms less severe.  You Must read complete instructions/literature along with all the possible adverse reactions/side effects for all the Medicines you take and that have been prescribed to you. Take any  new Medicines after you have completely understood and accpet all the possible adverse reactions/side effects.   Please note  You were cared for by a hospitalist during your hospital stay. If you have any questions about your discharge medications or the care you received while you were in the hospital after you are discharged, you can call the unit and asked to speak with the hospitalist on call if the hospitalist that took care of you is not available. Once you are discharged, your primary care physician will handle any further medical issues. Please note that NO REFILLS for any discharge medications will be authorized once you are discharged, as it is imperative that you return to your primary care physician (or establish a relationship with a primary care physician if you do not have one) for your aftercare needs so that they can reassess your need for medications and monitor your lab values.    On the day of Discharge:  VITAL SIGNS:   Blood pressure (!) 142/81, pulse (!) 102, temperature 98.7 F (37.1 C), temperature source Oral, resp. rate 20, height 5\' 9"  (1.753 m), weight 81.6 kg, SpO2 98 %.  PHYSICAL EXAMINATION:    GENERAL:  68 y.o.-year-old patient sitting in the bed with no acute distress.  EYES: Pupils equal, round, reactive to light and accommodation. No scleral icterus. Extraocular muscles intact.  HEENT: Head atraumatic, normocephalic. Oropharynx and nasopharynx clear.  NECK:  Supple, no jugular venous distention. No thyroid enlargement, no tenderness.  LUNGS: Normal breath sounds bilaterally, no wheezing, rales,rhonchi or crepitation. No use of accessory muscles of respiration.  CARDIOVASCULAR: S1, S2 normal. No murmurs, rubs, or gallops.  ABDOMEN: Soft, nontender, nondistended. Bowel sounds present. No organomegaly or mass.  EXTREMITIES: No pedal edema, cyanosis, or clubbing.  NEUROLOGIC: Cranial nerves II through XII are intact. Muscle strength 5/5 in all extremities.  Sensation intact. Gait not checked.  PSYCHIATRIC: The patient is alert and oriented x 3.  SKIN: No obvious rash, lesion, or ulcer.    DATA REVIEW:   CBC Recent Labs  Lab 09/11/19 1721 09/12/19 0518  WBC 9.0  --   HGB 9.7* 8.4*  HCT 29.9* 25.9*  PLT 226  --     Chemistries  Recent Labs  Lab 09/08/19 1813 09/09/19 0905  09/11/19 0506  NA 137 141   < > 142  K 3.8 3.3*   < > 3.1*  CL 107 112*   < > 114*  CO2 18* 22   < > 23  GLUCOSE 316* 168*   < > 121*  BUN 15 13   < > <5*  CREATININE 0.86 0.85   < > 0.78  CALCIUM 8.5* 8.2*   < > 8.5*  MG  --  1.9  --   --   AST 19  --   --   --   ALT 12  --   --   --   ALKPHOS 58  --   --   --   BILITOT 0.7  --   --   --    < > =  values in this interval not displayed.     Microbiology Results  Results for orders placed or performed during the hospital encounter of 09/08/19  SARS CORONAVIRUS 2 (TAT 6-24 HRS) Nasopharyngeal Nasopharyngeal Swab     Status: None   Collection Time: 09/08/19  7:04 PM   Specimen: Nasopharyngeal Swab  Result Value Ref Range Status   SARS Coronavirus 2 NEGATIVE NEGATIVE Final    Comment: (NOTE) SARS-CoV-2 target nucleic acids are NOT DETECTED. The SARS-CoV-2 RNA is generally detectable in upper and lower respiratory specimens during the acute phase of infection. Negative results do not preclude SARS-CoV-2 infection, do not rule out co-infections with other pathogens, and should not be used as the sole basis for treatment or other patient management decisions. Negative results must be combined with clinical observations, patient history, and epidemiological information. The expected result is Negative. Fact Sheet for Patients: SugarRoll.be Fact Sheet for Healthcare Providers: https://www.woods-mathews.com/ This test is not yet approved or cleared by the [place] FDA and  has been authorized for detection and/or diagnosis of SARS-CoV-2 by FDA under an  Emergency Use Authorization (EUA). This EUA will remain  in effect (meaning this test can be used) for the duration of the COVID-19 declaration under Section 56 4(b)(1) of the Act, 21 U.S.C. section 360bbb-3(b)(1), unless the authorization is terminated or revoked sooner. Performed at [facility], [place] [address], [place], [place] [address]     RADIOLOGY:  No results found.   Management plans discussed with the patient, family and they are in agreement.  CODE STATUS:     Code Status Orders  (From admission, onward)         Start     Ordered   09/08/19 1930  Full code  Continuous     09/08/19 1930        Code Status History    Date Active Date Inactive Code Status Order ID Comments User Context   01/23/2019 2250 01/24/2019 2229 Full Code [id number]  Lance Coon, MD Inpatient   Advance Care Planning Activity    Advance Directive Documentation     Most Recent Value  Type of Advance Directive  Healthcare Power of [place]  Pre-existing out of facility DNR order (yellow form or pink MOST form)  -  "MOST" Form in Place?  -      TOTAL TIME TAKING CARE OF THIS PATIENT: 38 minutes.    Gladstone Lighter M.D on 09/12/2019 at 9:43 AM  Between 7am to 6pm - Pager - [phone]  After 6pm go to [email or web address] - Proofreader  [facility]  Office  [phone]  CC: Primary care physician; System, Pcp Not In   Note: This dictation was prepared with Dragon dictation along with smaller phrase technology. Any transcriptional errors that result from this process are unintentional.

## 2019-09-12 NOTE — Progress Notes (Signed)
Jessica [name] to be D/C'd Home per MD order.  Discussed prescriptions and follow up appointments with the patient. Prescriptions given to patient, medication list explained in detail. Pt verbalized understanding.  Allergies as of 09/12/2019   No Known Allergies     Medication List    TAKE these medications   ferrous sulfate 325 (65 FE) MG tablet Take 1 tablet (325 mg total) by mouth 2 (two) times daily with a meal.   hydrALAZINE 25 MG tablet Commonly known as: APRESOLINE Take 1 tablet (25 mg total) by mouth 3 (three) times daily.   insulin aspart 100 UNIT/ML injection Commonly known as: novoLOG Inject 8 Units into the skin 3 (three) times daily before meals.   metFORMIN 500 MG tablet Commonly known as: GLUCOPHAGE Take 2 tablets (1,000 mg total) by mouth 2 (two) times daily with a meal.   metoprolol tartrate 25 MG tablet Commonly known as: LOPRESSOR Take 1 tablet (25 mg total) by mouth 2 (two) times daily.   nystatin-triamcinolone ointment Commonly known as: MYCOLOG Apply 1 application topically 2 (two) times daily. For vaginal candidiasis   pantoprazole 40 MG tablet Commonly known as: PROTONIX Take 1 tablet (40 mg total) by mouth daily.   Trulicity 1.5 0000000 Sopn Generic drug: Dulaglutide Inject 1 pen into the skin every 7 (seven) days.       Vitals:   09/11/19 2028 09/12/19 0450  BP: (!) 128/59 (!) 142/81  Pulse: (!) 102 (!) 102  Resp: 20 20  Temp: 98.6 F (37 C) 98.7 F (37.1 C)  SpO2: 100% 98%    Skin clean, dry and intact without evidence of skin break down, no evidence of skin tears noted. IV catheter discontinued intact. Site without signs and symptoms of complications. Dressing and pressure applied. Pt denies pain at this time. No complaints noted.  An After Visit Summary was printed and given to the patient. Patient escorted via [place], and D/C home via private auto.  Fuller Mandril, RN

## 2019-09-12 NOTE — Telephone Encounter (Signed)
Did recall for patient . Tried to call patient but patient answered and did not say anything will try again later

## 2019-09-12 NOTE — Discharge Instructions (Signed)
Gastrointestinal Bleeding °Gastrointestinal (GI) bleeding is bleeding somewhere along the digestive tract, between the mouth and the anus. This tract includes the mouth, esophagus, stomach, small intestine, large intestine, and anus. The large intestine is often called the colon. °GI bleeding can be caused by various problems. The severity of these problems can range from mild to serious or even life-threatening. If you have GI bleeding, you may find blood in your stools (feces), you may have black stools, or you may vomit blood. If there is a lot of bleeding, you may need to stay in the hospital. °What are the causes? °This condition may be caused by: °· Inflammation, irritation, or swelling of the esophagus (esophagitis). The esophagus is part of the body that moves food from your mouth to your stomach. °· Swollen veins in the rectum (hemorrhoids). °· Areas of painful tearing in the anus that are often caused by passing hard stool (anal fissures). °· Pouches that form on the colon over time, with age, and may bleed a lot (diverticulosis). °· Inflammation (diverticulitis) in areas with diverticulosis. This can cause pain, fever, and bloody stools, although bleeding may be mild. °· Growths (polyps) or cancer. Colon cancer often starts out as precancerous polyps. °· Gastritis and ulcers. With these, bleeding may come from the upper GI tract, near the stomach. °What increases the risk? °You are more likely to develop this condition if you: °· Have an infection in your stomach from a type of bacteria called Helicobacter pylori. °· Take certain medicines, such as: °? NSAIDs. °? Aspirin. °? Selective serotonin reuptake inhibitors (SSRIs). °? Steroids. °? Antiplatelet or anticoagulant medicines. °· Smoke. °· Drink alcohol. °What are the signs or symptoms? °Common symptoms of this condition include: °· Bright red blood in your vomit, or vomit that looks like coffee grounds. °· Bloody, black, or tarry stools. °? Bleeding  from the lower GI tract will usually cause red or maroon blood in the stools. °? Bleeding from the upper GI tract may cause black, tarry stools that are often stronger smelling than usual. °? In certain cases, if the bleeding is fast enough, the stools may be red. °· Pain or cramping in the abdomen. °How is this diagnosed? °This condition may be diagnosed based on: °· Your medical history and a physical exam. °· Various tests, such as: °? Blood tests. °? Stool tests. °? X-rays and other imaging tests. °? Esophagogastroduodenoscopy (EGD). In this test, a flexible, lighted tube is used to look at your esophagus, stomach, and small intestine. °? Colonoscopy. In this test, a flexible, lighted tube is used to look at your colon. °How is this treated? °Treatment for this condition depends on the cause of the bleeding. For example: °· For bleeding from the esophagus, stomach, small intestine, or colon, the health care provider doing your EGD or colonoscopy may be able to stop the bleeding as part of the procedure. °· Inflammation or infection of the colon can be treated with medicines. °· Certain rectal problems can be treated with creams, suppositories, or warm baths. °· Medicines may be given to reduce acid in your stomach. °· Surgery is sometimes needed. °· Blood transfusions are sometimes needed if a lot of blood has been lost. °If bleeding is mild, you may be allowed to go home. If there is a lot of bleeding, you will need to stay in the hospital for observation. °Follow these instructions at home: ° °· Take over-the-counter and prescription medicines only as told by your health care provider. °·   Eat foods that are high in fiber, such as beans, whole grains, and fresh fruits and vegetables. This will help to keep your stools soft. Eating 1-3 prunes each day works well for many people. °· Drink enough fluid to keep your urine pale yellow. °· Keep all follow-up visits as told by your health care provider. This is  important. °Contact a health care provider if: °· Your symptoms do not improve. °Get help right away if: °· Your bleeding does not stop. °· You feel light-headed or you faint. °· You feel weak. °· You have severe cramps in your back or abdomen. °· You pass large blood clots in your stool. °· Your symptoms are getting worse. °· You have chest pain or fast heartbeats. °Summary °· Gastrointestinal (GI) bleeding is bleeding somewhere along the digestive tract, between the mouth and anus. GI bleeding can be caused by various problems. The severity of these problems can range from mild to serious or even life-threatening. °· Treatment for this condition depends on the cause of the bleeding. °· Take over-the-counter and prescription medicines only as told by your health care provider. °· Keep all follow-up visits as told by your health care provider. This is important. °· Get help right away if your bleeding increases, your symptoms are getting worse, or you have new symptoms. °This information is not intended to replace advice given to you by your health care provider. Make sure you discuss any questions you have with your health care provider. °Document Released: 12/03/2000 Document Revised: 07/19/2018 Document Reviewed: 07/19/2018 °Elsevier Patient Education © 2020 [employer] ° °

## 2019-09-12 NOTE — Telephone Encounter (Signed)
Patient verbalized understanding. Made appointment 10/29/19 at 1pm

## 2019-09-12 NOTE — Progress Notes (Signed)
   Jessica Antigua, MD [address] [place], [place], [place], [address] [address], [place], [place], [place], [address] Phone: [phone]  Fax: [phone]   Subjective: Patient denies any further hematochezia.  No abdominal pain, no nausea or vomiting   Objective: Exam: Vital signs in last 24 hours: Vitals:   09/11/19 1429 09/11/19 1501 09/11/19 2028 09/12/19 0450  BP: (!) 145/91 (!) 145/64 (!) 128/59 (!) 142/81  Pulse: (!) 102 88 (!) 102 (!) 102  Resp: 18  20 20   Temp:   98.6 F (37 C) 98.7 F (37.1 C)  TempSrc:   Oral Oral  SpO2: 100% 100% 100% 98%  Weight:      Height:       Weight change:   Intake/Output Summary (Last 24 hours) at 09/12/2019 1212 Last data filed at 09/12/2019 0900 Gross per 24 hour  Intake 4968.4 ml  Output -  Net 4968.4 ml    General: No acute distress, AAO x3 Abd: Soft, NT/ND, No HSM Skin: Warm, no rashes Neck: Supple, Trachea midline   Lab Results: Lab Results  Component Value Date   WBC 9.0 09/11/2019   HGB 8.4 (L) 09/12/2019   HCT 25.9 (L) 09/12/2019   MCV 86.2 09/11/2019   PLT 226 09/11/2019   Micro Results: Recent Results (from the past 240 hour(s))  SARS CORONAVIRUS 2 (TAT 6-24 HRS) Nasopharyngeal Nasopharyngeal Swab     Status: None   Collection Time: 09/08/19  7:04 PM   Specimen: Nasopharyngeal Swab  Result Value Ref Range Status   SARS Coronavirus 2 NEGATIVE NEGATIVE Final    Comment: (NOTE) SARS-CoV-2 target nucleic acids are NOT DETECTED. The SARS-CoV-2 RNA is generally detectable in upper and lower respiratory specimens during the acute phase of infection. Negative results do not preclude SARS-CoV-2 infection, do not rule out co-infections with other pathogens, and should not be used as the sole basis for treatment or other patient management decisions. Negative results must be combined with clinical observations, patient history, and epidemiological information. The expected result is Negative. Fact  Sheet for Patients: [employer] Fact Sheet for Healthcare Providers: https://www.woods-mathews.com/ This test is not yet approved or cleared by the [place] FDA and  has been authorized for detection and/or diagnosis of SARS-CoV-2 by FDA under an Emergency Use Authorization (EUA). This EUA will remain  in effect (meaning this test can be used) for the duration of the COVID-19 declaration under Section 56 4(b)(1) of the Act, 21 U.S.C. section 360bbb-3(b)(1), unless the authorization is terminated or revoked sooner. Performed at [facility], [place] [address], [place], [place] [address]    Studies/Results: No results found. Medications:  Scheduled Meds: . hydrALAZINE  25 mg Oral Q8H  . insulin aspart  0-5 Units Subcutaneous QHS  . insulin aspart  0-9 Units Subcutaneous TID WC  . pantoprazole  40 mg Oral Daily   Continuous Infusions: . sodium chloride Stopped (09/11/19 1241)  . sodium chloride 0 mL/hr at 09/09/19 1225   PRN Meds:.sodium chloride   Assessment: Active Problems:   GI bleed    Plan: Hemoglobin stable No further bleeding Pathology from colon polyp pending Patient to follow-up in clinic closely to reassess symptoms and if symptoms recur in the meantime she was asked to call notify us or go to the ER and she verbalized understanding   LOS: 4 days   Jessica Antigua, MD 09/12/2019, 12:12 PM

## 2019-09-12 NOTE — Telephone Encounter (Signed)
-----   Message from Virgel Manifold, MD sent at 09/12/2019  3:13 PM EDT ----- Please let the pt know the polyp removed was benign but precancerous. Repeat recommended in 3 years. She should have clinic follow up with me in 4-6 weeks

## 2019-09-25 NOTE — Anesthesia Postprocedure Evaluation (Signed)
Anesthesia Post Note  Patient: Jessica [name]  Procedure(s) Performed: COLONOSCOPY (N/A )  Patient location during evaluation: PACU Anesthesia Type: General Level of consciousness: awake and alert Pain management: pain level controlled Vital Signs Assessment: post-procedure vital signs reviewed and stable Respiratory status: spontaneous breathing, nonlabored ventilation, respiratory function stable and patient connected to nasal cannula oxygen Cardiovascular status: blood pressure returned to baseline and stable Postop Assessment: no apparent nausea or vomiting Anesthetic complications: no     Last Vitals:  Vitals:   09/11/19 2028 09/12/19 0450  BP: (!) 128/59 (!) 142/81  Pulse: (!) 102 (!) 102  Resp: 20 20  Temp: 37 C 37.1 C  SpO2: 100% 98%    Last Pain:  Vitals:   09/12/19 0952  TempSrc:   PainSc: 0-No pain                 Molli Barrows

## 2019-10-05 ENCOUNTER — Telehealth: Payer: Self-pay

## 2019-10-05 NOTE — Telephone Encounter (Signed)
Called patient to do their pre-visit COVID screening.  Call went to voicemail. Unable to do prescreening.  

## 2019-10-08 ENCOUNTER — Ambulatory Visit (INDEPENDENT_AMBULATORY_CARE_PROVIDER_SITE_OTHER): Payer: Medicare Other | Admitting: Family Medicine

## 2019-10-08 ENCOUNTER — Other Ambulatory Visit: Payer: Self-pay

## 2019-10-08 ENCOUNTER — Other Ambulatory Visit: Payer: Self-pay | Admitting: Family Medicine

## 2019-10-08 VITALS — BP 143/86 | HR 81 | Temp 97.3°F | Resp 17 | Ht 65.0 in | Wt 186.0 lb

## 2019-10-08 DIAGNOSIS — D500 Iron deficiency anemia secondary to blood loss (chronic): Secondary | ICD-10-CM | POA: Diagnosis not present

## 2019-10-08 DIAGNOSIS — E1165 Type 2 diabetes mellitus with hyperglycemia: Secondary | ICD-10-CM | POA: Diagnosis not present

## 2019-10-08 DIAGNOSIS — Z7689 Persons encountering health services in other specified circumstances: Secondary | ICD-10-CM

## 2019-10-08 DIAGNOSIS — K219 Gastro-esophageal reflux disease without esophagitis: Secondary | ICD-10-CM | POA: Insufficient documentation

## 2019-10-08 DIAGNOSIS — R002 Palpitations: Secondary | ICD-10-CM | POA: Diagnosis not present

## 2019-10-08 DIAGNOSIS — R110 Nausea: Secondary | ICD-10-CM

## 2019-10-08 DIAGNOSIS — Z09 Encounter for follow-up examination after completed treatment for conditions other than malignant neoplasm: Secondary | ICD-10-CM

## 2019-10-08 DIAGNOSIS — Z79899 Other long term (current) drug therapy: Secondary | ICD-10-CM

## 2019-10-08 DIAGNOSIS — I10 Essential (primary) hypertension: Secondary | ICD-10-CM | POA: Diagnosis not present

## 2019-10-08 NOTE — Progress Notes (Signed)
Subjective:  Patient ID: Jessica [name], female    DOB: 02/22/1951  Age: 68 y.o. MRN: [id number]  CC: Establish Care and Diabetes   HPI Jessica [name], 68 yo female, who presents to establish ongoing care of chronic medical issues.  She is status post hospitalization at [facility] from 09/08/2019 through 09/12/2019 with admission diagnosis of acute blood loss anemia and gastrointestinal hemorrhage and discharge diagnosis of GI bleed.  She additionally has history of uncontrolled type 2 diabetes.  Per hospital notes, patient presented to the ED due to rectal bleeding and was found to have hemoglobin of 6.7 and was given 2 units of packed red blood cells with improvement of hemoglobin to 8.  Patient had colonoscopy which showed diverticulosis and a sessile polyp was removed.  Patient was discharged on oral iron supplements.  Patient was placed on Protonix for GERD.  She also had replacement therapy due to hyperkalemia.  Metoprolol was started for hypertension in addition to hydralazine which patient was on prior to admission.  She was also to continue Trulicity, Metformin and short acting insulin.  Hemoglobin A1c on 09/09/2019 was 8.2 and A1c was also done on 09/08/2019 and was 10.0..  Most recent CBC on 09/11/2019 had hemoglobin of 9.7.  Basic metabolic panel on [id number] with low potassium at 3.1 with glucose of 121 and normal creatinine.       At today's visit, she reports no further rectal bleeding since hospitalization.  She does have some fatigue but this is improving.  She reports that since her hospitalization she has been having the sensation of palpitations as if her heart skips a beat.  She denies any actual chest pain.  She denies any shortness of breath or cough, no abdominal pain-no constipation or diarrhea.  She has had recurrent issues with nausea and she believes that her nausea may be caused by her metformin use and she would like to discontinue this medication.  Patient states that she  was not aware of any order regarding the use of short acting insulin before meals.  She is currently on Trulicity and Metformin.  She reports that she is out of diabetic testing supplies but would like to have a [employer] monitoring system.  She reports home blood sugars ranging between 130-170 and blood sugar in the 300s once after drinking a large glass of grape juice.  She denies any current issues with urinary frequency, no increased thirst and no blurred vision.  She has not had a recent diabetic eye exam.  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        Patient reports that she currently takes hydralazine 25 mg daily though this was prescribed per hospital discharge records and per patient's past records as a 3 times daily medication.  She reports that she never started the metoprolol that was prescribed at the time of her recent hospital discharge.  She denies any headaches or dizziness related to her blood pressure.  She is not interested in having an influenza immunization.         She would like referral to a GYN as she has not established with one in the area.  She would like to have mammogram and Pap smear done per GYN and she has also  felt a small lump in her vaginal area, nontender for which she would like further evaluation by GYN.  She reports surgical history significant for removal of one fallopian tube due to an ectopic pregnancy but she is not sure if this was on the right or left.  She has had no postmenopausal bleeding and denies any current abdominal or pelvic pain.  Past Medical History:  Diagnosis Date  . Diabetes mellitus without complication ([place])   . GI bleed 09/08/2019    Past Surgical History:  Procedure Laterality Date  . BUNIONECTOMY Right   .  COLONOSCOPY N/A 09/11/2019   Procedure: COLONOSCOPY;  Surgeon: Virgel Manifold, MD;  Location: [facility] ENDOSCOPY;  Service: Endoscopy;  Laterality: N/A;  . UNILATERAL SALPINGECTOMY     due to ectopic pregnancy but she does not recall if left or right    Family History  Family history unknown: Yes  Patient is not sure about illnesses in her family.  Social History   Tobacco Use  . Smoking status: Never Smoker  . Smokeless tobacco: Never Used  Substance Use Topics  . Alcohol use: Not Currently  Upon questioning, patient reports that she used to smoke for about 30 years and quit at the age of 62.  Patient stated that she only smokes socially but when asked how much she smoked per day she stated half a pack.  ROS Review of Systems  Constitutional: Positive for fatigue (Improving). Negative for chills and fever.  HENT: Negative for sore throat and trouble swallowing.   Eyes: Negative for photophobia and visual disturbance.  Respiratory: Negative for cough and shortness of breath.   Cardiovascular: Positive for palpitations. Negative for chest pain and leg swelling.  Gastrointestinal: Positive for nausea. Negative for abdominal pain, blood in stool, constipation and diarrhea.  Endocrine: Negative for cold intolerance, heat intolerance, polydipsia, polyphagia and polyuria.  Genitourinary: Negative for dysuria, frequency, pelvic pain, vaginal bleeding and vaginal pain.  Musculoskeletal: Negative for arthralgias, back pain and gait problem.  Neurological: Negative for dizziness, numbness and headaches.  Hematological: Negative for adenopathy. Does not bruise/bleed easily.    Objective:   Today's Vitals: BP (!) 143/86   Pulse 81   Temp (!) 97.3 F (36.3 C) (Temporal)   Resp 17   Ht 5\' 5"  (1.651 m)   Wt 186 lb (84.4 kg)   SpO2 95%   BMI 30.95 kg/m   Physical Exam Vitals signs and nursing note (Well-nourished well-developed older female in no acute distress, patient is wearing  mask as per office protocol.  Patient appears to have some difficulty with her hearing) reviewed.  Constitutional:      General: She is not in acute distress.    Appearance: Normal appearance. She is normal weight. She is not ill-appearing.  Neck:     Musculoskeletal: Normal range of motion and neck supple. No neck rigidity.  Cardiovascular:     Rate and Rhythm: Normal rate and regular rhythm.     Pulses:          Dorsalis pedis pulses are 1+ on the right side and 1+ on the left side.       Posterior tibial pulses are 1+ on the right side and 1+ on the left side.  Pulmonary:     Effort: Pulmonary effort is normal.     Breath sounds: Normal breath sounds.  Abdominal:     Palpations: Abdomen is soft.     Tenderness: There is no abdominal tenderness. There is no  right CVA tenderness, left CVA tenderness, guarding or rebound.  Musculoskeletal:        General: No tenderness or deformity.     Right lower leg: No edema.     Left lower leg: No edema.     Right foot: Normal range of motion. Bunion present. No Charcot foot or foot drop.     Left foot: Normal range of motion. Bunion present. No Charcot foot or foot drop.     Comments: No CVA tenderness, no reproducible back pain on exam  Feet:     Right foot:     Protective Sensation: 10 sites tested. 10 sites sensed.     Skin integrity: Callus and dry skin present. No ulcer, blister, skin breakdown, erythema, warmth or fissure.     Toenail Condition: Right toenails are long.     Left foot:     Protective Sensation: 10 sites tested. 9 sites sensed.     Skin integrity: Callus (Mild callus on the left heel but patient also with abnormal monofilament/absent sensation in this area) and dry skin present. No ulcer, blister, skin breakdown, erythema, warmth or fissure.     Toenail Condition: Left toenails are long.  Lymphadenopathy:     Cervical: No cervical adenopathy.  Skin:    General: Skin is warm and dry.  Neurological:     General: No focal  deficit present.     Mental Status: She is alert and oriented to person, place, and time.  Psychiatric:        Mood and Affect: Mood normal.        Behavior: Behavior normal.     Comments: Patient appears to have poor insight into her medical condition and lack of judgment     Assessment & Plan:  1. Encounter to establish care Patient presents to establish care.  She moved to the area from [place] after retiring and has not established with a primary care physician.  She requests referral to GYN for her mammogram and Pap smear as well as concerns about a nodule in the vaginal area.  Referral placed for GYN. - Ambulatory referral to Gynecology  2. Hospital discharge follow-up She is status post hospitalization from 09/08/2019 through 09/12/2019 due to rectal bleeding and acute blood loss anemia.  Patient had colonoscopy with removal of sessile polyp and blood transfusion during hospitalization.  She was discharged on metoprolol to help with hypertension but did not start this medication.  Discharge summary also mentions patient resuming home short acting insulin before meals but patient states that she was never on this medication.  Patient was also prescribed pantoprazole for acid reflux symptoms and due to her GI blood loss but patient did not start this medication.  Patient is to keep upcoming appointment with GI in follow-up of her recent hospitalization.  3. Uncontrolled type 2 diabetes mellitus with hyperglycemia ([place]) During patient's hospitalization, she did have hemoglobin A1c but of course this is likely not truly representative of her diabetes control status as patient had acute blood loss anemia with hemoglobin of 6.7 on admission and then received 2 units of packed red blood cells both of which would affect hemoglobin A1c readings.  Patient will have BMP at today's visit in follow-up of her diabetes.  Patient will also be referred to endocrinology as a suspect that she has poorly  controlled diabetes long-term and patient reports that she is having GI upset with Metformin and wishes to stop this medication.  She is currently on  Trulicity.  We will also check lipase since patient with complaint of nausea and Trulicity can cause issues with the pancreas.  Patient will be referred for diabetic eye exam and follow-up with podiatry for diabetic foot care.  She additionally will have lipid panel as I could not find previous value in the chart and patient would benefit from statin medication to help reduce the risk of CAD associated with diabetes.  Educational material on blood sugar monitoring and living with diabetes provided to the patient as part of her after visit summary-AVS. - Basic Metabolic Panel - Ambulatory referral to Endocrinology - Lipase - Ambulatory referral to Ophthalmology - Lipid Panel - TSH  4. Palpitations; 5.  Hypokalemia Patient with complaint of palpitations.  She also has hypertension.  Prescription was provided at hospital discharge for metoprolol which patient did not start.  She will have labs to look for possible causes of her palpitations including BMP as she also had hypokalemia with potassium level of 3.1 during hospitalization, CBC as patient with anemia and will have TSH to look for thyroid disorder as a possible cause of her palpitations.  She has been referred to cardiology for further evaluation. - CBC with Differential - Basic Metabolic Panel - Ambulatory referral to Cardiology - TSH  5. Essential hypertension Patient with hypertension and currently takes hydralazine 25 mg daily but medication apparently was initially prescribed as 3 times daily.  Patient was prescribed metoprolol at time of recent hospital discharge which she did not start.  Patient also with complaint of palpitations.  Patient will have lipid panel at today's visit and is also being referred to cardiology in follow-up of her palpitations. - Lipid Panel - Ambulatory referral  to Cardiology  6. Anemia due to GI blood loss Patient is status post hospitalization due to rectal bleeding.  She reports no additional bleeding since hospital discharge.  She was prescribed iron therapy which she is not currently taking.  Hemoglobin A1c was per discharge summary around 8 at the time of discharge.  Patient will have repeat CBC done in follow-up of anemia at today's visit. - CBC with Differential  7. Gastroesophageal reflux disease without esophagitis; 8. Nausea Patient with complaint of nausea and patient's hospital discharge states that patient was prescribed pantoprazole which per patient she did not get filled and is not taking.  She believes that her nausea is related to her use of metformin which she would like to discontinue.  Discussed with the patient that I would like for her to continue the Metformin until she has her follow-up appointment with endocrinology and discussed with endocrinologist.  She also is on Trulicity which can cause issues with the pancreas and may lead to nausea therefore lipase level will also be checked at today's visit.  Patient also likely has some element of GERD but does not wish to take pantoprazole at this time.  Will check lipase level and hepatic function panel in follow-up of patient's complaint of nausea.  She is to keep upcoming GI appointment status post hospital discharge. - Lipase - Hepatic Function Panel  9. Encounter for long-term (current) use of medications Patient will have basic metabolic panel and lipase in follow-up of her use of medications for hypertension and diabetes.  - Basic Metabolic Panel - Lipase   Outpatient Encounter Medications as of 10/08/2019  Medication Sig  . Dulaglutide (TRULICITY) 1.5 0000000 SOPN Inject 1 pen into the skin every 7 (seven) days.  . ferrous sulfate 325 (65 FE) MG tablet  Take 1 tablet (325 mg total) by mouth 2 (two) times daily with a meal.  . hydrALAZINE (APRESOLINE) 25 MG tablet Take 1  tablet (25 mg total) by mouth 3 (three) times daily. (Patient taking differently: Take 25 mg by mouth daily. )  . metFORMIN (GLUCOPHAGE) 500 MG tablet Take 2 tablets (1,000 mg total) by mouth 2 (two) times daily with a meal.  . metoprolol tartrate (LOPRESSOR) 25 MG tablet Take 1 tablet (25 mg total) by mouth 2 (two) times daily. (Patient not taking: Reported on 10/08/2019)  . [DISCONTINUED] insulin aspart (NOVOLOG) 100 UNIT/ML injection Inject 8 Units into the skin 3 (three) times daily before meals. (Patient not taking: Reported on 10/08/2019)  . [DISCONTINUED] nystatin-triamcinolone ointment (MYCOLOG) Apply 1 application topically 2 (two) times daily. For vaginal candidiasis  . [DISCONTINUED] pantoprazole (PROTONIX) 40 MG tablet Take 1 tablet (40 mg total) by mouth daily.   No facility-administered encounter medications on file as of 10/08/2019.    *She was offered but declined influenza immunization at today's visit  An After Visit Summary was printed and given to the patient.  Follow-up: An After Visit Summary was printed and given to the patient.  More than 30 minutes spent in face-to-face time with the patient obtaining past medical history, current history, reviewing and discussing medications, medical conditions and need for specialty follow-up.  P[employer]-Covering Provider

## 2019-10-08 NOTE — Patient Instructions (Signed)
Blood Glucose Monitoring, Adult Monitoring your blood sugar (glucose) is an important part of managing your diabetes (diabetes mellitus). Blood glucose monitoring involves checking your blood glucose as often as directed and keeping a record (log) of your results over time. Checking your blood glucose regularly and keeping a blood glucose log can:  Help you and your health care provider adjust your diabetes management plan as needed, including your medicines or insulin.  Help you understand how food, exercise, illnesses, and medicines affect your blood glucose.  Let you know what your blood glucose is at any time. You can quickly find out if you have low blood glucose (hypoglycemia) or high blood glucose (hyperglycemia). Your health care provider will set individualized treatment goals for you. Your goals will be based on your age, other medical conditions you have, and how you respond to diabetes treatment. Generally, the goal of treatment is to maintain the following blood glucose levels:  Before meals (preprandial): 80-130 mg/dL (4.4-7.2 mmol/L).  After meals (postprandial): below 180 mg/dL (10 mmol/L).  A1c level: less than 7%. Supplies needed:  Blood glucose meter.  Test strips for your meter. Each meter has its own strips. You must use the strips that came with your meter.  A needle to prick your finger (lancet). Do not use a lancet more than one time.  A device that holds the lancet (lancing device).  A journal or log book to write down your results. How to check your blood glucose  1. Wash your hands with soap and water. 2. Prick the side of your finger (not the tip) with the lancet. Use a different finger each time. 3. Gently rub the finger until a small drop of blood appears. 4. Follow instructions that come with your meter for inserting the test strip, applying blood to the strip, and using your blood glucose meter. 5. Write down your result and any notes. Some meters  allow you to use areas of your body other than your finger (alternative sites) to test your blood. The most common alternative sites are:  Forearm.  Thigh.  Palm of the hand. If you think you may have hypoglycemia, or if you have a history of not knowing when your blood glucose is getting low (hypoglycemia unawareness), do not use alternative sites. Use your finger instead. Alternative sites may not be as accurate as the fingers, because blood flow is slower in these areas. This means that the result you get may be delayed, and it may be different from the result that you would get from your finger. Follow these instructions at home: Blood glucose log   Every time you check your blood glucose, write down your result. Also write down any notes about things that may be affecting your blood glucose, such as your diet and exercise for the day. This information can help you and your health care provider: ? Look for patterns in your blood glucose over time. ? Adjust your diabetes management plan as needed.  Check if your meter allows you to download your records to a computer. Most glucose meters store a record of glucose readings in the meter. If you have type 1 diabetes:  Check your blood glucose 2 or more times a day.  Also check your blood glucose: ? Before every insulin injection. ? Before and after exercise. ? Before meals. ? 2 hours after a meal. ? Occasionally between 2:00 a.m. and 3:00 a.m., as directed. ? Before potentially dangerous tasks, like driving or using heavy machinery. ?   At bedtime.  You may need to check your blood glucose more often, up to 6-10 times a day, if you: ? Use an insulin pump. ? Need multiple daily injections (MDI). ? Have diabetes that is not well-controlled. ? Are ill. ? Have a history of severe hypoglycemia. ? Have hypoglycemia unawareness. If you have type 2 diabetes:  If you take insulin or other diabetes medicines, check your blood glucose 2 or  more times a day.  If you are on intensive insulin therapy, check your blood glucose 4 or more times a day. Occasionally, you may also need to check between 2:00 a.m. and 3:00 a.m., as directed.  Also check your blood glucose: ? Before and after exercise. ? Before potentially dangerous tasks, like driving or using heavy machinery.  You may need to check your blood glucose more often if: ? Your medicine is being adjusted. ? Your diabetes is not well-controlled. ? You are ill. General tips  Always keep your supplies with you.  If you have questions or need help, all blood glucose meters have a 24-hour "hotline" phone number that you can call. You may also contact your health care provider.  After you use a few boxes of test strips, adjust (calibrate) your blood glucose meter by following instructions that came with your meter. Contact a health care provider if:  Your blood glucose is at or above 240 mg/dL (13.3 mmol/L) for 2 days in a row.  You have been sick or have had a fever for 2 days or longer, and you are not getting better.  You have any of the following problems for more than 6 hours: ? You cannot eat or drink. ? You have nausea or vomiting. ? You have diarrhea. Get help right away if:  Your blood glucose is lower than 54 mg/dL (3 mmol/L).  You become confused or you have trouble thinking clearly.  You have difficulty breathing.  You have moderate or large ketone levels in your urine. Summary  Monitoring your blood sugar (glucose) is an important part of managing your diabetes (diabetes mellitus).  Blood glucose monitoring involves checking your blood glucose as often as directed and keeping a record (log) of your results over time.  Your health care provider will set individualized treatment goals for you. Your goals will be based on your age, other medical conditions you have, and how you respond to diabetes treatment.  Every time you check your blood glucose,  write down your result. Also write down any notes about things that may be affecting your blood glucose, such as your diet and exercise for the day. This information is not intended to replace advice given to you by your health care provider. Make sure you discuss any questions you have with your health care provider. Document Released: 12/09/2003 Document Revised: 09/29/2018 Document Reviewed: 05/17/2016 Elsevier Patient Education  2020 [place] With Diabetes Diabetes (type 1 diabetes mellitus or type 2 diabetes mellitus) is a condition in which the body does not have enough of a hormone called insulin, or the body does not respond properly to insulin. Normally, insulin allows sugars (glucose) to enter cells in the body. The cells use glucose for energy. With diabetes, extra glucose builds up in the blood instead of going into cells, which results in high blood glucose (hyperglycemia). How to manage lifestyle changes Managing diabetes includes medical treatments as well as lifestyle changes. If diabetes is not managed well, serious physical and emotional complications can occur. Taking  good care of yourself means that you are responsible for:  Monitoring glucose regularly.  Eating a healthy diet.  Exercising regularly.  Meeting with health care providers.  Taking medicines as directed. Some people may feel a lot of stress about managing their diabetes. This is known as emotional distress, and it is very common. Living with diabetes can place you at risk for emotional distress, depression, or anxiety. These disorders can be confusing and can make diabetes management more difficult. How to recognize stress Emotional distress Symptoms of emotional distress include:  Anger about having a diagnosis of diabetes.  Fear or frustration about your diagnosis and the changes you need to make to manage the condition.  Being overly worried about the care that you need or the cost of the  care that you need.  Feeling like you caused your condition by doing something wrong.  Fear of unpredictable situations, like low or high blood glucose.  Feeling judged by your health care providers.  Feeling very alone with the disease.  Getting too tired or worn out with the demands of daily care. Depression Having diabetes means that you are at a higher risk for depression. Having depression also means that you are at a higher risk for diabetes. Your health care provider may test (screen) you for symptoms of depression. It is important to recognize depression symptoms and to start treatment for depression soon after it is diagnosed. The following are some symptoms of depression:  Loss of interest in things that you used to enjoy.  Trouble sleeping, or often waking up early and not being able to get back to sleep.  A change in appetite.  Feeling tired most of the day.  Feeling nervous and anxious.  Feeling guilty and worrying that you are a burden to others.  Feeling depressed more often than you do not feel that way.  Thoughts of hurting yourself or feeling that you want to die. If you have any of these symptoms for 2 weeks or longer, reach out to a health care provider. Follow these instructions at home: Managing emotional distress The following are some ways to manage emotional distress:  Talk with your health care provider or certified diabetes educator. Consider working with a counselor or therapist.  Learn as much as you can about diabetes and its treatment. Meet with a certified diabetes educator or take a class to learn how to manage your condition.  Keep a journal of your thoughts and concerns.  Accept that some things are out of your control.  Talk with other people who have diabetes. It can help to talk with others about the emotional distress that you feel.  Find ways to manage stress that work for you. These may include art or music therapy, exercise,  meditation, and hobbies.  Seek support from spiritual leaders, family, and friends. General instructions  Follow your diabetes management plan.  Keep all follow-up visits as told by your health care provider. This is important. Where to find support   Ask your health care provider to recommend a therapist who understands both depression and diabetes.  Search for information and support from the American Diabetes Association: www.diabetes.org  Find a certified diabetes educator and make an appointment through [place] of Diabetes Educators: www.diabeteseducator.org Get help right away if:  You have thoughts about hurting yourself or others. If you ever feel like you may hurt yourself or others, or have thoughts about taking your own life, get help right away. You can  go to your nearest emergency department or call:  Your local emergency services (911 in the [place]  A suicide crisis helpline, such as the [place] at 269-495-5669. This is open 24 hours a day. Summary  Diabetes (type 1 diabetes mellitus or type 2 diabetes mellitus) is a condition in which the body does not have enough of a hormone called insulin, or the body does not respond properly to insulin.  Living with diabetes puts you at risk for medical issues, and it also puts you at risk for emotional issues such as emotional distress, depression, and anxiety.  Recognizing the symptoms of emotional distress and depression may help you avoid problems with your diabetes control. It is important to start treatment for emotional distress and depression soon after they are diagnosed.  Having diabetes means that you are at a higher risk for depression. Ask your health care provider to recommend a therapist who understands both depression and diabetes.  If you experience symptoms of emotional distress or depression, it is important to discuss this with your health care provider, certified  diabetes educator, or therapist. This information is not intended to replace advice given to you by your health care provider. Make sure you discuss any questions you have with your health care provider. Document Released: 04/21/2017 Document Revised: 12/18/2018 Document Reviewed: 04/21/2017 Elsevier Patient Education  2020 [employer].

## 2019-10-08 NOTE — Progress Notes (Signed)
A1C 10.0 & 8.2 a month ago in hospital.  Wants to know if she can be prescribed the [facility] monitor.  Denies numbness/tingling in her feet, polyuria, polydipsia, vision changes. Has some nausea & vomiting from Metformin. Wants to know if it can be changed.  Still having palpitations. Looks like Metoprolol was started in the hospital but she isn't currently taking it. Is also supposed to be on Hydralazine 25 mg TID but is only taking it once a day.  Needs a referral to GYN. For her pap & mammo. She also states that she has a lump in the vaginal area that needs to be evaluated.

## 2019-10-09 LAB — CBC WITH DIFFERENTIAL/PLATELET
Basophils Absolute: 0 x10E3/uL (ref 0.0–0.2)
Basos: 1 %
EOS (ABSOLUTE): 0.1 x10E3/uL (ref 0.0–0.4)
Eos: 2 %
Hematocrit: 35 % (ref 34.0–46.6)
Hemoglobin: 11.4 g/dL (ref 11.1–15.9)
Immature Grans (Abs): 0 x10E3/uL (ref 0.0–0.1)
Immature Granulocytes: 0 %
Lymphocytes Absolute: 2.6 x10E3/uL (ref 0.7–3.1)
Lymphs: 40 %
MCH: 27.6 pg (ref 26.6–33.0)
MCHC: 32.6 g/dL (ref 31.5–35.7)
MCV: 85 fL (ref 79–97)
Monocytes Absolute: 0.5 x10E3/uL (ref 0.1–0.9)
Monocytes: 7 %
Neutrophils Absolute: 3.2 x10E3/uL (ref 1.4–7.0)
Neutrophils: 50 %
Platelets: 269 x10E3/uL (ref 150–450)
RBC: 4.13 x10E6/uL (ref 3.77–5.28)
RDW: 14.1 % (ref 11.7–15.4)
WBC: 6.4 x10E3/uL (ref 3.4–10.8)

## 2019-10-09 LAB — BASIC METABOLIC PANEL WITH GFR
BUN/Creatinine Ratio: 8 — ABNORMAL LOW (ref 12–28)
BUN: 7 mg/dL — ABNORMAL LOW (ref 8–27)
CO2: 22 mmol/L (ref 20–29)
Calcium: 10.4 mg/dL — ABNORMAL HIGH (ref 8.7–10.3)
Chloride: 105 mmol/L (ref 96–106)
Creatinine, Ser: 0.83 mg/dL (ref 0.57–1.00)
GFR calc Af Amer: 85 mL/min/1.73
GFR calc non Af Amer: 74 mL/min/1.73
Glucose: 175 mg/dL — ABNORMAL HIGH (ref 65–99)
Potassium: 4.3 mmol/L (ref 3.5–5.2)
Sodium: 140 mmol/L (ref 134–144)

## 2019-10-09 LAB — HEPATIC FUNCTION PANEL
ALT: 14 IU/L (ref 0–32)
AST: 16 IU/L (ref 0–40)
Albumin: 4.3 g/dL (ref 3.8–4.8)
Alkaline Phosphatase: 101 IU/L (ref 39–117)
Bilirubin Total: 0.3 mg/dL (ref 0.0–1.2)
Bilirubin, Direct: 0.1 mg/dL (ref 0.00–0.40)
Total Protein: 6.9 g/dL (ref 6.0–8.5)

## 2019-10-09 LAB — LIPID PANEL
Chol/HDL Ratio: 3.5 ratio (ref 0.0–4.4)
Cholesterol, Total: 263 mg/dL — ABNORMAL HIGH (ref 100–199)
HDL: 76 mg/dL
LDL Chol Calc (NIH): 160 mg/dL — ABNORMAL HIGH (ref 0–99)
Triglycerides: 155 mg/dL — ABNORMAL HIGH (ref 0–149)
VLDL Cholesterol Cal: 27 mg/dL (ref 5–40)

## 2019-10-09 LAB — LIPASE: Lipase: 28 U/L (ref 14–72)

## 2019-10-09 LAB — TSH: TSH: 1.11 u[IU]/mL (ref 0.450–4.500)

## 2019-10-14 ENCOUNTER — Other Ambulatory Visit (HOSPITAL_BASED_OUTPATIENT_CLINIC_OR_DEPARTMENT_OTHER): Payer: Medicare Other | Admitting: Family Medicine

## 2019-10-14 DIAGNOSIS — Z79899 Other long term (current) drug therapy: Secondary | ICD-10-CM | POA: Diagnosis not present

## 2019-10-14 DIAGNOSIS — E782 Mixed hyperlipidemia: Secondary | ICD-10-CM

## 2019-10-14 MED ORDER — ROSUVASTATIN CALCIUM 20 MG PO TABS: 20.0000 mg | ORAL_TABLET | Freq: Every day | ORAL | 3 refills | Status: DC

## 2019-10-14 NOTE — Progress Notes (Signed)
Patient ID: Jessica [name], female   DOB: 06-30-1951, 68 y.o.   MRN: [id number]   Patient with elevated LDL of 160 on recent blood work and patient is diabetic.  We will send in prescription to patient's pharmacy for generic Crestor 20 mg daily as well as lab order for repeat LFTs in 6 weeks after starting medication.

## 2019-10-23 ENCOUNTER — Encounter: Payer: Self-pay | Admitting: *Deleted

## 2019-10-25 ENCOUNTER — Ambulatory Visit: Payer: Medicare Other | Admitting: Cardiology

## 2019-10-29 ENCOUNTER — Encounter: Payer: Self-pay | Admitting: Gastroenterology

## 2019-10-29 ENCOUNTER — Other Ambulatory Visit: Payer: Self-pay

## 2019-10-29 ENCOUNTER — Ambulatory Visit: Payer: Medicare Other | Admitting: Gastroenterology

## 2019-10-29 VITALS — BP 131/83 | HR 88 | Temp 98.4°F | Ht 65.0 in | Wt 184.4 lb

## 2019-10-29 DIAGNOSIS — K5730 Diverticulosis of large intestine without perforation or abscess without bleeding: Secondary | ICD-10-CM | POA: Diagnosis not present

## 2019-10-29 NOTE — Progress Notes (Signed)
Jessica Antigua, MD [address]  [place]  [place], [place] [address]  Main: [phone]  Fax: [phone]   Primary Care Physician: System, Pcp Not In   Chief Complaint  Patient presents with  . New Patient (Initial Visit)  . Hospitalization Follow-up    Patient was seen in the hospital for gastric hemorrhage. Patient was giving Protonix but is out of medication     HPI: Jessica [name] is a 68 y.o. female here for hospital follow-up of hematochezia.  Patient underwent colonoscopy during the hospital patient in September 2020, which showed diverticulosis, no active bleeding.  1, 12 mm polyp was seen in the transverse colon.  Pathology shows large sessile serrated polyp.  Patient has not had any further bleeding since hospitalization.  Hemoglobin has normalized on last check in October 2020.  The patient denies abdominal or flank pain, anorexia, nausea or vomiting, dysphagia, change in bowel habits or black or bloody stools or weight loss.  Reports soft stools daily without straining, 1 a day   Current Outpatient Medications  Medication Sig Dispense Refill  . Dulaglutide (TRULICITY) 1.5 0000000 SOPN Inject 1 pen into the skin every 7 (seven) days. 1 pen 4  . ferrous sulfate 325 (65 FE) MG tablet Take 1 tablet (325 mg total) by mouth 2 (two) times daily with a meal. 60 tablet 2  . hydrALAZINE (APRESOLINE) 25 MG tablet Take 1 tablet (25 mg total) by mouth 3 (three) times daily. (Patient taking differently: Take 25 mg by mouth daily. ) 90 tablet 0  . metFORMIN (GLUCOPHAGE) 500 MG tablet Take 2 tablets (1,000 mg total) by mouth 2 (two) times daily with a meal. 120 tablet 1  . metoprolol tartrate (LOPRESSOR) 25 MG tablet Take 1 tablet (25 mg total) by mouth 2 (two) times daily. 60 tablet 2  . rosuvastatin (CRESTOR) 20 MG tablet Take 1 tablet (20 mg total) by mouth daily. To lower cholesterol 90 tablet 3   No current facility-administered medications for this visit.      Allergies as of 10/29/2019  . (No Known Allergies)    ROS:  General: Negative for anorexia, weight loss, fever, chills, fatigue, weakness. ENT: Negative for hoarseness, difficulty swallowing , nasal congestion. CV: Negative for chest pain, angina, palpitations, dyspnea on exertion, peripheral edema.  Respiratory: Negative for dyspnea at rest, dyspnea on exertion, cough, sputum, wheezing.  GI: See history of present illness. GU:  Negative for dysuria, hematuria, urinary incontinence, urinary frequency, nocturnal urination.  Endo: Negative for unusual weight change.    Physical Examination:   BP 131/83 (BP Location: Left Arm, Patient Position: Sitting, Cuff Size: Normal)   Pulse 88   Temp 98.4 F (36.9 C) (Oral)   Ht 5\' 5"  (1.651 m)   Wt 184 lb 6 oz (83.6 kg)   BMI 30.68 kg/m   General: Well-nourished, well-developed in no acute distress.  Eyes: No icterus. Conjunctivae pink. Mouth: Oropharyngeal mucosa moist and pink , no lesions erythema or exudate. Neck: Supple, Trachea midline Abdomen: Bowel sounds are normal, nontender, nondistended, no hepatosplenomegaly or masses, no abdominal bruits or hernia , no rebound or guarding.   Extremities: No lower extremity edema. No clubbing or deformities. Neuro: Alert and oriented x 3.  Grossly intact. Skin: Warm and dry, no jaundice.   Psych: Alert and cooperative, normal mood and affect.   Labs: CMP     Component Value Date/Time   NA 140 10/08/2019 1331   K 4.3 10/08/2019 1331  CL 105 10/08/2019 1331   CO2 22 10/08/2019 1331   GLUCOSE 175 (H) 10/08/2019 1331   GLUCOSE 121 (H) 09/11/2019 0506   BUN 7 (L) 10/08/2019 1331   CREATININE 0.83 10/08/2019 1331   CALCIUM 10.4 (H) 10/08/2019 1331   PROT 6.9 10/08/2019 1331   ALBUMIN 4.3 10/08/2019 1331   AST 16 10/08/2019 1331   ALT 14 10/08/2019 1331   ALKPHOS 101 10/08/2019 1331   BILITOT 0.3 10/08/2019 1331   GFRNONAA 74 10/08/2019 1331   GFRAA 85 10/08/2019 1331   Lab  Results  Component Value Date   WBC 6.4 10/08/2019   HGB 11.4 10/08/2019   HCT 35.0 10/08/2019   MCV 85 10/08/2019   PLT 269 10/08/2019    Imaging Studies: No results found.  Assessment and Plan:   Jessica [name] is a 68 y.o. y/o female with recent hospitalization for painless rectal bleeding in September 2020, with colonoscopy showing 12 mm sessile serrated polyp that was removed, diverticulosis, no active bleeding, with resolution of bleeding since then  Source of bleeding is likely diverticular in nature Avoid constipation High-fiber diet  If symptoms recur patient advised to call us or go to the ER and she verbalized understanding  Follow-up in 1 year or earlier if needed    Dr Jessica [name]

## 2019-10-29 NOTE — Patient Instructions (Signed)

## 2019-10-30 ENCOUNTER — Other Ambulatory Visit: Payer: Self-pay

## 2019-11-08 ENCOUNTER — Encounter: Payer: Self-pay | Admitting: Cardiology

## 2019-11-08 ENCOUNTER — Ambulatory Visit (INDEPENDENT_AMBULATORY_CARE_PROVIDER_SITE_OTHER): Payer: Medicare Other | Admitting: Cardiology

## 2019-11-08 ENCOUNTER — Other Ambulatory Visit: Payer: Self-pay

## 2019-11-08 VITALS — BP 134/94 | HR 79 | Ht 65.0 in | Wt 187.2 lb

## 2019-11-08 DIAGNOSIS — R002 Palpitations: Secondary | ICD-10-CM | POA: Diagnosis not present

## 2019-11-08 DIAGNOSIS — R0602 Shortness of breath: Secondary | ICD-10-CM | POA: Diagnosis not present

## 2019-11-08 DIAGNOSIS — E785 Hyperlipidemia, unspecified: Secondary | ICD-10-CM

## 2019-11-08 DIAGNOSIS — I10 Essential (primary) hypertension: Secondary | ICD-10-CM | POA: Diagnosis not present

## 2019-11-08 MED ORDER — ROSUVASTATIN CALCIUM 20 MG PO TABS: 20.0000 mg | ORAL_TABLET | Freq: Every day | ORAL | 3 refills | Status: DC

## 2019-11-08 NOTE — Progress Notes (Signed)
Cardiology Office Note:    Date:  11/08/2019   ID:  Jessica [name], DOB February 25, 1951, MRN [id number]  PCP:  System, Pcp Not In  Cardiologist:  No primary care provider on file.  Electrophysiologist:  None   Referring MD: Antony Blackbird, MD   Chief Complaint  Patient presents with  . Palpitations     History of Present Illness:    Jessica [name] is a 68 y.o. female with a hx of type 2 diabetes, hypertension who is referred by Dr. Chapman Fitch for an evaluation of palpitations.  She was admitted to hospital  from 09/07/2022 through 09/12/2019 for GI bleed.  Hgb was down to 6.7 on presentation.  Colonoscopy showed diverticulosis.  She reports that she had an abnormal heart rhythm while admitted and was started on metoprolol.  States that heart was racing.  She states that since she got out of the hospital she has had more episodes where she feels like her heart is racing.  Occurs for 2 to 3 minutes and resolves.  She has a history of these episodes but had not had it for years.  Now occurring 1-2 times per week.  Also reports has been having worsening dyspnea on exertion.  States that she will walk 2 miles per day but if she tries to extend her walks she feels significant shortness of breath.  Denies any exertional chest pain.  Father died of MI at 49.  Sister had stents in early 3s.  Quit smoking 20 years ago.    Past Medical History:  Diagnosis Date  . Diabetes mellitus without complication ([place])   . GI bleed 09/08/2019    Past Surgical History:  Procedure Laterality Date  . BUNIONECTOMY Right   . COLONOSCOPY N/A 09/11/2019   Procedure: COLONOSCOPY;  Surgeon: Virgel Manifold, MD;  Location: [facility] ENDOSCOPY;  Service: Endoscopy;  Laterality: N/A;  . UNILATERAL SALPINGECTOMY     due to ectopic pregnancy but she does not recall if left or right    Current Medications: Current Meds  Medication Sig  . Dulaglutide (TRULICITY) 1.5 0000000 SOPN Inject 1 pen into the skin every 7 (seven) days.  .  hydrALAZINE (APRESOLINE) 25 MG tablet Take 25 mg by mouth daily.  . metFORMIN (GLUCOPHAGE) 500 MG tablet Take 2 tablets (1,000 mg total) by mouth 2 (two) times daily with a meal.  . metoprolol tartrate (LOPRESSOR) 25 MG tablet Take 1 tablet (25 mg total) by mouth 2 (two) times daily.     Allergies:   Patient has no known allergies.   Social History   Socioeconomic History  . Marital status: Single    Spouse name: Not on file  . Number of children: Not on file  . Years of education: Not on file  . Highest education level: Not on file  Occupational History  . Not on file  Social Needs  . Financial resource strain: Not on file  . Food insecurity    Worry: Not on file    Inability: Not on file  . Transportation needs    Medical: Not on file    Non-medical: Not on file  Tobacco Use  . Smoking status: Never Smoker  . Smokeless tobacco: Never Used  Substance and Sexual Activity  . Alcohol use: Not Currently  . Drug use: Never  . Sexual activity: Not on file  Lifestyle  . Physical activity    Days per week: Not on file    Minutes per session: Not on file  .  Stress: Not on file  Relationships  . Social Herbalist on phone: Not on file    Gets together: Not on file    Attends religious service: Not on file    Active member of club or organization: Not on file    Attends meetings of clubs or organizations: Not on file    Relationship status: Not on file  Other Topics Concern  . Not on file  Social History Narrative  . Not on file  Former smoker  Family History: The patient's family history includes Colon cancer in her mother; Colon polyps in her mother.  ROS:   Please see the history of present illness.     All other systems reviewed and are negative.  EKGs/Labs/Other Studies Reviewed:    The following studies were reviewed today:   EKG:  EKG is  ordered today.  The ekg ordered today demonstrates normal sinus rhythm, rate 79, no ST/T abnormalities   Recent Labs: 09/09/2019: Magnesium 1.9 10/08/2019: ALT 14; BUN 7; Creatinine, Ser 0.83; Hemoglobin 11.4; Platelets 269; Potassium 4.3; Sodium 140; TSH 1.110  Recent Lipid Panel    Component Value Date/Time   CHOL 263 (H) 10/08/2019 1331   TRIG 155 (H) 10/08/2019 1331   HDL 76 10/08/2019 1331   CHOLHDL 3.5 10/08/2019 1331   LDLCALC 160 (H) 10/08/2019 1331    Physical Exam:    VS:  BP (!) 134/94   Pulse 79   Ht 5\' 5"  (1.651 m)   Wt 187 lb 3.2 oz (84.9 kg)   SpO2 98%   BMI 31.15 kg/m     Wt Readings from Last 3 Encounters:  11/08/19 187 lb 3.2 oz (84.9 kg)  10/29/19 184 lb 6 oz (83.6 kg)  10/08/19 186 lb (84.4 kg)     GEN:  Well nourished, well developed in no acute distress HEENT: Normal NECK: No JVD; No carotid bruits LYMPHATICS: No lymphadenopathy CARDIAC: RRR, no murmurs, rubs, gallops RESPIRATORY:  Clear to auscultation without rales, wheezing or rhonchi  ABDOMEN: Soft, non-tender, non-distended MUSCULOSKELETAL:  No edema; No deformity  SKIN: Warm and dry NEUROLOGIC:  Alert and oriented x 3 PSYCHIATRIC:  Normal affect   ASSESSMENT:    1. SOB (shortness of breath)   2. Palpitation   3. Essential hypertension   4. Hyperlipidemia, unspecified hyperlipidemia type    PLAN:    Palpitations: Description concerning for arrhythmia, possibly SVT.  Reportedly had episode while admitted at [facility] and was started on metoprolol, but I cannot find documentation of this.  Will order Zio patch x1 week  Dyspnea on exertion: We will check TTE to rule out structural heart disease.  She is very concerned about coronary disease given her family history.  Symptoms could represent anginal equivalent, will check exercise treadmill test  Hypertension: on metoprolol 25 mg twice daily and hydralazine 25 mg 3 times daily  Hyperlipidemia: On rosuvastatin 20 mg daily.  Type 2 diabetes: A1c 8.2 on 09/09/2019.  On Trulicity  RTC in 3 months  Medication Adjustments/Labs and Tests  Ordered: Current medicines are reviewed at length with the patient today.  Concerns regarding medicines are outlined above.  Orders Placed This Encounter  Procedures  . Exercise Tolerance Test  . LONG TERM MONITOR (3-14 DAYS)  . EKG 12-Lead  . ECHOCARDIOGRAM COMPLETE   Meds ordered this encounter  Medications  . rosuvastatin (CRESTOR) 20 MG tablet    Sig: Take 1 tablet (20 mg total) by mouth daily.  Dispense:  90 tablet    Refill:  3    Patient Instructions  Medication Instructions:  Your Physician recommend you continue on your current medication as directed.    *If you need a refill on your cardiac medications before your next appointment, please call your pharmacy*  Lab Work: None  Testing/Procedures: Your physician has requested that you have an echocardiogram. Echocardiography is a painless test that uses sound waves to create images of your heart. It provides your doctor with information about the size and shape of your heart and how well your heart's chambers and valves are working. This procedure takes approximately one hour. There are no restrictions for this procedure. [place] [address]  Our physician has recommended that you wear an 7  DAY ZIO-PATCH monitor. The Zio patch cardiac monitor continuously records heart rhythm data for up to 14 days, this is for patients being evaluated for multiple types heart rhythms. For the first 24 hours post application, please avoid getting the Zio monitor wet in the shower or by excessive sweating during exercise. After that, feel free to carry on with regular activities. Keep soaps and lotions away from the ZIO XT Patch.         Your physician has requested that you have an exercise tolerance test. For further information please visit [email or web address]. Please also follow instruction sheet, as given. [place]  COVID test 3 day prior [place]  Follow-Up: At [facility], you and your health needs are our priority.  As part of our continuing mission to provide you with exceptional heart care, we have created designated Provider Care Teams.  These Care Teams include your primary Cardiologist (physician) and Advanced Practice Providers (APPs -  Physician Assistants and Nurse Practitioners) who all work together to provide you with the care you need, when you need it.  Your next appointment:   3 month(s)  The format for your next appointment:   In Person  Provider:   Oswaldo Milian, MD     [facility] at [facility] [address], [place], [place] [address] Phone:  [phone]        You are scheduled for an Exercise Stress Test.  Please arrive 15 minutes prior to your appointment time for registration and insurance purposes.  The test will take approximately 45 minutes to complete.  How to prepare for your Exercise Stress Test: Do bring a list of your current medications with you.  If not listed below, you may take your medications as normal. . Do not take metoprolol (Lopressor, Toprol) for 24 hours prior to the test.  Bring the medication to your appointment as you may be required to take it once the test is complete. . Do wear comfortable clothes (no dresses or overalls) and walking shoes, tennis shoes preferred (no heels or open toed shoes are allowed) . Do Not wear cologne, perfume, aftershave or lotions (deodorant is allowed). . Please report to [place], [address] for your test.  If these instructions are not followed, your test will have to be rescheduled.  If you have questions or concerns about your appointment, you can call the Stress Lab at [phone].  If you cannot keep your appointment, please provide 24 hours notification to the Stress Lab, to avoid a possible $50 charge to your account  ZIO XT- Long Term Monitor Instructions   Your physician has requested  you wear your  ZIO patch monitor___7____days.   This is a single patch monitor.  [employer] supplies one patch monitor per enrollment.  Additional stickers are not available.   Please do not apply patch if you will be having a Nuclear Stress Test, Echocardiogram, Cardiac CT, MRI, or Chest Xray during the time frame you would be wearing the monitor. The patch cannot be worn during these tests.  You cannot remove and re-apply the ZIO XT patch monitor.   Your ZIO patch monitor will be sent USPS Priority mail from [facility] directly to your home address. The monitor may also be mailed to a PO BOX if home delivery is not available.   It may take 3-5 days to receive your monitor after you have been enrolled.   Once you have received you monitor, please review enclosed instructions.  Your monitor has already been registered assigning a specific monitor serial # to you.   Applying the monitor   Shave hair from upper left chest.   Hold abrader disc by orange tab.  Rub abrader in 40 strokes over left upper chest as indicated in your monitor instructions.   Clean area with 4 enclosed alcohol pads .  Use all pads to assure are is cleaned thoroughly.  Let dry.   Apply patch as indicated in monitor instructions.  Patch will be place under collarbone on left side of chest with arrow pointing upward.   Rub patch adhesive wings for 2 minutes.Remove white label marked "1".  Remove white label marked "2".  Rub patch adhesive wings for 2 additional minutes.   While looking in a mirror, press and release button in center of patch.  A small green light will flash 3-4 times .  This will be your only indicator the monitor has been turned on.     Do not shower for the first 24 hours.  You may shower after the first 24 hours.   Press button if you feel a symptom. You will hear a small click.  Record Date, Time and Symptom in the Patient Log Book.   When you are ready to remove patch, follow instructions on  last 2 pages of Patient Log Book.  Stick patch monitor onto last page of Patient Log Book.   Place Patient Log Book in [place] box.  Use locking tab on box and tape box closed securely.  The [employer] and [employer] has [employer] on it.  Please place in mailbox as soon as possible.  Your physician should have your test results approximately 7 days after the monitor has been mailed back to [facility].   Call [place] at [phone] if you have questions regarding your ZIO XT patch monitor.  Call them immediately if you see an orange light blinking on your monitor.   If your monitor falls off in less than 4 days contact our Monitor department at [phone].  If your monitor becomes loose or falls off after 4 days call [employer] at [phone] for suggestions on securing your monitor.       Signed, Donato Heinz, MD  11/08/2019 5:07 PM    [place]

## 2019-11-08 NOTE — Patient Instructions (Signed)
Medication Instructions:  Your Physician recommend you continue on your current medication as directed.    *If you need a refill on your cardiac medications before your next appointment, please call your pharmacy*  Lab Work: None  Testing/Procedures: Your physician has requested that you have an echocardiogram. Echocardiography is a painless test that uses sound waves to create images of your heart. It provides your doctor with information about the size and shape of your heart and how well your heart's chambers and valves are working. This procedure takes approximately one hour. There are no restrictions for this procedure. [place] [address]  Our physician has recommended that you wear an 7  DAY ZIO-PATCH monitor. The Zio patch cardiac monitor continuously records heart rhythm data for up to 14 days, this is for patients being evaluated for multiple types heart rhythms. For the first 24 hours post application, please avoid getting the Zio monitor wet in the shower or by excessive sweating during exercise. After that, feel free to carry on with regular activities. Keep soaps and lotions away from the ZIO XT Patch.         Your physician has requested that you have an exercise tolerance test. For further information please visit [email or web address]. Please also follow instruction sheet, as given. [place]. [address]  COVID test 3 day prior [place]  Follow-Up: At [facility], you and your health needs are our priority.  As part of our continuing mission to provide you with exceptional heart care, we have created designated Provider Care Teams.  These Care Teams include your primary Cardiologist (physician) and Advanced Practice Providers (APPs -  Physician Assistants and Nurse Practitioners) who all work together to provide you with the care you need, when you need it.  Your next appointment:   3 month(s)  The format for your next  appointment:   In Person  Provider:   Oswaldo Milian, MD     [place] [facility] at [facility] [address], [place], [place] [address] Phone:  [phone]        You are scheduled for an Exercise Stress Test.  Please arrive 15 minutes prior to your appointment time for registration and insurance purposes.  The test will take approximately 45 minutes to complete.  How to prepare for your Exercise Stress Test: Do bring a list of your current medications with you.  If not listed below, you may take your medications as normal. . Do not take metoprolol (Lopressor, Toprol) for 24 hours prior to the test.  Bring the medication to your appointment as you may be required to take it once the test is complete. . Do wear comfortable clothes (no dresses or overalls) and walking shoes, tennis shoes preferred (no heels or open toed shoes are allowed) . Do Not wear cologne, perfume, aftershave or lotions (deodorant is allowed). . Please report to [place], [address] for your test.  If these instructions are not followed, your test will have to be rescheduled.  If you have questions or concerns about your appointment, you can call the Stress Lab at [phone].  If you cannot keep your appointment, please provide 24 hours notification to the Stress Lab, to avoid a possible $50 charge to your account  ZIO XT- Long Term Monitor Instructions   Your physician has requested you wear your ZIO patch monitor___7____days.   This is a single patch monitor.  [employer] supplies one patch monitor  per enrollment.  Additional stickers are not available.   Please do not apply patch if you will be having a Nuclear Stress Test, Echocardiogram, Cardiac CT, MRI, or Chest Xray during the time frame you would be wearing the monitor. The patch cannot be worn during these tests.  You cannot remove and re-apply the ZIO XT patch monitor.   Your ZIO patch  monitor will be sent USPS Priority mail from [facility] directly to your home address. The monitor may also be mailed to a PO BOX if home delivery is not available.   It may take 3-5 days to receive your monitor after you have been enrolled.   Once you have received you monitor, please review enclosed instructions.  Your monitor has already been registered assigning a specific monitor serial # to you.   Applying the monitor   Shave hair from upper left chest.   Hold abrader disc by orange tab.  Rub abrader in 40 strokes over left upper chest as indicated in your monitor instructions.   Clean area with 4 enclosed alcohol pads .  Use all pads to assure are is cleaned thoroughly.  Let dry.   Apply patch as indicated in monitor instructions.  Patch will be place under collarbone on left side of chest with arrow pointing upward.   Rub patch adhesive wings for 2 minutes.Remove white label marked "1".  Remove white label marked "2".  Rub patch adhesive wings for 2 additional minutes.   While looking in a mirror, press and release button in center of patch.  A small green light will flash 3-4 times .  This will be your only indicator the monitor has been turned on.     Do not shower for the first 24 hours.  You may shower after the first 24 hours.   Press button if you feel a symptom. You will hear a small click.  Record Date, Time and Symptom in the Patient Log Book.   When you are ready to remove patch, follow instructions on last 2 pages of Patient Log Book.  Stick patch monitor onto last page of Patient Log Book.   Place Patient Log Book in [place] box.  Use locking tab on box and tape box closed securely.  The [employer] has [employer] on it.  Please place in mailbox as soon as possible.  Your physician should have your test results approximately 7 days after the monitor has been mailed back to [facility].   Call [place] at  [phone] if you have questions regarding your ZIO XT patch monitor.  Call them immediately if you see an orange light blinking on your monitor.   If your monitor falls off in less than 4 days contact our Monitor department at [phone].  If your monitor becomes loose or falls off after 4 days call [employer] at [phone] for suggestions on securing your monitor.

## 2019-11-13 ENCOUNTER — Encounter: Payer: Self-pay | Admitting: Medical

## 2019-11-13 ENCOUNTER — Encounter: Payer: Medicare Other | Admitting: Medical

## 2019-11-14 ENCOUNTER — Telehealth: Payer: Self-pay

## 2019-11-14 NOTE — Telephone Encounter (Signed)
7 day ZIO XT ordered and mailed to pt.  Went over instructions with pt. She has an Echo scheduled for 12/4 and will wait to put on monitor after that appt.

## 2019-11-23 ENCOUNTER — Other Ambulatory Visit: Payer: Self-pay

## 2019-11-23 ENCOUNTER — Ambulatory Visit (HOSPITAL_COMMUNITY): Payer: Medicare Other | Attending: Cardiology

## 2019-11-23 DIAGNOSIS — R0602 Shortness of breath: Secondary | ICD-10-CM | POA: Diagnosis not present

## 2019-11-26 ENCOUNTER — Ambulatory Visit (INDEPENDENT_AMBULATORY_CARE_PROVIDER_SITE_OTHER): Payer: Medicare Other | Admitting: Family Medicine

## 2019-11-26 ENCOUNTER — Encounter: Payer: Self-pay | Admitting: Family Medicine

## 2019-11-26 ENCOUNTER — Ambulatory Visit: Payer: Medicare Other

## 2019-11-26 DIAGNOSIS — E782 Mixed hyperlipidemia: Secondary | ICD-10-CM

## 2019-11-26 DIAGNOSIS — I10 Essential (primary) hypertension: Secondary | ICD-10-CM | POA: Diagnosis not present

## 2019-11-26 DIAGNOSIS — E1165 Type 2 diabetes mellitus with hyperglycemia: Secondary | ICD-10-CM

## 2019-11-26 MED ORDER — METFORMIN HCL 500 MG PO TABS: 500.0000 mg | ORAL_TABLET | Freq: Two times a day (BID) | ORAL | 1 refills | Status: DC

## 2019-11-26 MED ORDER — ONETOUCH VERIO VI STRP: ORAL_STRIP | 5 refills | Status: DC

## 2019-11-26 MED ORDER — ONETOUCH VERIO W/DEVICE KIT: PACK | 0 refills | Status: DC

## 2019-11-26 MED ORDER — ONETOUCH DELICA LANCETS 33G MISC: 5 refills | Status: AC

## 2019-11-26 MED ORDER — TRULICITY 1.5 MG/0.5ML ~~LOC~~ SOAJ: 1.0000 "pen " | SUBCUTANEOUS | 4 refills | Status: DC

## 2019-11-26 NOTE — Progress Notes (Signed)
Following up on DM. She states that she needs a new glucometer & supplies.  Denies nausea, vomiting, numbness/tingling in feet, polyuria, polydipsia.

## 2019-11-26 NOTE — Progress Notes (Signed)
Virtual Visit via Telephone Note  I connected with Jessica [name] on 11/26/19 at  1:30 PM EST by telephone and verified that I am speaking with the correct person using two identifiers.   I discussed the limitations, risks, security and privacy concerns of performing an evaluation and management service by telephone and the availability of in person appointments. I also discussed with the patient that there may be a patient responsible charge related to this service. The patient expressed understanding and agreed to proceed.  Patient Location: Home Provider Location: [facility] Office Others participating in call: none    History of Present Illness:      Jessica [name], 68 yo female with diabetes who reports that she has been unable to check her blood sugar, as her glucometer is not working.  Her current glucometer is a [employer].  She states that she is unable therefore to give any report as to how her home blood sugars have been running.  She did have a few days of increased thirst, frequent urination after eating something that she should not have.  She denies any current urinary frequency or dysuria, no current increased thirst, no issues with numbness or tingling in the feet and no blurred vision.        She is taking the rosuvastatin for her cholesterol and has had no increase in muscle or joint pain related to the new medication.  She continues to take her blood pressure medication and states that she did have a headache for a few days recently after eating a bag of potato chips but no current issues with headaches or dizziness related to her blood pressure.           She is currently on Trulicity for her diabetes and denies any nausea or mid upper abdominal pain.  She also would like to have a change in her metformin.  She is currently on 1000 g twice daily and states that this is causing her to have diarrhea and sensation of stomach upset.  She denies any other current symptoms, no abdominal pain,  no constipation, no blood in the stool or black stools.  No chest pain or palpitations, no shortness of breath or cough.  No recent fever or chills.  No difficulty swallowing and no sore throat.  No peripheral edema.   Past Medical History:  Diagnosis Date  . Diabetes mellitus without complication ([place])   . GI bleed 09/08/2019    Past Surgical History:  Procedure Laterality Date  . BUNIONECTOMY Right   . COLONOSCOPY N/A 09/11/2019   Procedure: COLONOSCOPY;  Surgeon: Virgel Manifold, MD;  [facility];  Service: Endoscopy;  Laterality: N/A;  . UNILATERAL SALPINGECTOMY     due to ectopic pregnancy but she does not recall if left or right    Family History  Problem Relation Age of Onset  . Colon cancer Mother   . Colon polyps Mother     Social History   Tobacco Use  . Smoking status: Never Smoker  . Smokeless tobacco: Never Used  Substance Use Topics  . Alcohol use: Not Currently  . Drug use: Never     No Known Allergies     Observations/Objective: No vital signs or physical exam conducted as visit was done via telephone  Assessment and Plan: 1. Uncontrolled type 2 diabetes mellitus with hyperglycemia (Jessica) Patient's last hemoglobin A1c on 09/09/2019 per chart was 8.2.  She reports that she has been unable to check her  blood sugars as her glucometer has not been working.  Order will be placed for new glucometer and testing supplies.  She has been asked to schedule 6-week follow-up here at the office in follow-up of diabetes and to make sure that she has recorded/kept a blood sugar diary.  She is also encouraged to call the office of her fasting blood sugars are staying above 140.  2. Essential hypertension She is currently on hydralazine and metoprolol.  She has not monitored her home blood pressures.  She needs urine micro albumin and would benefit from addition of an ACE inhibitor or angiotensin receptor blocking agent to help with not only blood pressure but  also renal protection due to her uncontrolled diabetes.  3.  Mixed hyperlipidemia Lipid panel was done on 10/08/2019 with total cholesterol of 263, triglycerides of 155 and LDL of 160.  Patient has started the use of rosuvastatin and reports that she has had no issues with the medication.  Suggest hepatic panel at her follow-up visit due to new medication start.  Follow Up Instructions:Return in about 6 weeks (around 01/07/2020) for MD/HTN follow-up.    I discussed the assessment and treatment plan with the patient. The patient was provided an opportunity to ask questions and all were answered. The patient agreed with the plan and demonstrated an understanding of the instructions.   The patient was advised to call back or seek an in-person evaluation if the symptoms worsen or if the condition fails to improve as anticipated.  I provided 11 minutes of non-face-to-face time during this encounter.   Antony Blackbird, MD

## 2019-11-28 ENCOUNTER — Telehealth: Payer: Self-pay | Admitting: Cardiology

## 2019-11-28 NOTE — Telephone Encounter (Signed)
° ° °  Please return call with echo results

## 2019-11-28 NOTE — Telephone Encounter (Signed)
Follow Up   Patient returning call for echo results. Please give patient a call back.

## 2019-11-28 NOTE — Telephone Encounter (Signed)
Left message to call back  

## 2019-11-29 ENCOUNTER — Other Ambulatory Visit: Payer: Self-pay

## 2019-11-29 DIAGNOSIS — I422 Other hypertrophic cardiomyopathy: Secondary | ICD-10-CM

## 2019-11-29 DIAGNOSIS — Z01812 Encounter for preprocedural laboratory examination: Secondary | ICD-10-CM

## 2019-11-29 NOTE — Telephone Encounter (Signed)
Pt updated with test results and MD's recommendations. New orders placed.

## 2019-12-04 ENCOUNTER — Telehealth (HOSPITAL_COMMUNITY): Payer: Self-pay

## 2019-12-04 ENCOUNTER — Encounter (HOSPITAL_COMMUNITY): Payer: Self-pay | Admitting: Cardiology

## 2019-12-04 NOTE — Telephone Encounter (Signed)
Encounter complete. 

## 2019-12-06 ENCOUNTER — Inpatient Hospital Stay (HOSPITAL_COMMUNITY): Admission: RE | Admit: 2019-12-06 | Payer: Medicare Other | Source: Ambulatory Visit

## 2019-12-07 ENCOUNTER — Ambulatory Visit (INDEPENDENT_AMBULATORY_CARE_PROVIDER_SITE_OTHER): Payer: Medicare Other | Admitting: *Deleted

## 2019-12-07 ENCOUNTER — Other Ambulatory Visit: Payer: Self-pay

## 2019-12-07 ENCOUNTER — Encounter: Payer: Self-pay | Admitting: *Deleted

## 2019-12-07 VITALS — BP 146/84 | HR 84 | Ht 69.0 in | Wt 186.6 lb

## 2019-12-07 DIAGNOSIS — R300 Dysuria: Secondary | ICD-10-CM

## 2019-12-07 LAB — POCT URINALYSIS DIP (DEVICE)
Bilirubin Urine: NEGATIVE
Glucose, UA: 500 mg/dL — AB
Ketones, ur: NEGATIVE mg/dL
Nitrite: NEGATIVE
Protein, ur: 30 mg/dL — AB
Specific Gravity, Urine: 1.025 (ref 1.005–1.030)
Urobilinogen, UA: 0.2 mg/dL (ref 0.0–1.0)
pH: 6 (ref 5.0–8.0)

## 2019-12-07 NOTE — Progress Notes (Signed)
Pt reports burning with urination x2 weeks. Urine specimen obtained and sent to lab for culture. Pt also reports having a cyst on her mons which has been getting bigger. Brief inspection of this area shows a reddened, raised area approximately 1.5 cm in diameter. I advised pt to apply warm soaks to the area several times daily. Pt states she recently moved to [place] from [place] and was referred to our office for Gyn care by her PCP whom she has seen 2 times. She is in need of an annual Gyn exam as well as Mammogram. Pt will schedule appointment. Pt voiced understanding of all information and instructions given.

## 2019-12-08 NOTE — Progress Notes (Signed)
Patient seen and assessed by nursing staff during this encounter. I have reviewed the chart and agree with the documentation and plan.  Aletha Halim, MD 12/08/2019 11:36 AM

## 2019-12-09 ENCOUNTER — Encounter: Payer: Self-pay | Admitting: Obstetrics and Gynecology

## 2019-12-09 DIAGNOSIS — R8271 Bacteriuria: Secondary | ICD-10-CM | POA: Insufficient documentation

## 2019-12-09 LAB — URINE CULTURE

## 2019-12-09 MED ORDER — NITROFURANTOIN MONOHYD MACRO 100 MG PO CAPS: 100.0000 mg | ORAL_CAPSULE | Freq: Two times a day (BID) | ORAL | 0 refills | Status: AC

## 2019-12-09 NOTE — Addendum Note (Signed)
Addended by: Aletha Halim on: 12/09/2019 08:53 AM   Modules accepted: Orders

## 2019-12-10 ENCOUNTER — Telehealth: Payer: Self-pay | Admitting: *Deleted

## 2019-12-10 NOTE — Telephone Encounter (Signed)
Called pt and left message on her personal VM stating that her test results from last week show that she has a bladder infection. Dr. Ilda Basset has sent a prescription to her pharmacy. Dosing instructions were given. She may call back if she has questions.

## 2019-12-12 ENCOUNTER — Encounter: Payer: Self-pay | Admitting: Family Medicine

## 2019-12-12 ENCOUNTER — Other Ambulatory Visit: Payer: Self-pay

## 2019-12-12 ENCOUNTER — Ambulatory Visit: Payer: Medicare Other | Admitting: Family Medicine

## 2019-12-12 NOTE — Progress Notes (Signed)
Not seen

## 2019-12-17 ENCOUNTER — Ambulatory Visit (INDEPENDENT_AMBULATORY_CARE_PROVIDER_SITE_OTHER): Payer: Medicare Other | Admitting: Internal Medicine

## 2019-12-17 ENCOUNTER — Encounter: Payer: Self-pay | Admitting: Internal Medicine

## 2019-12-17 ENCOUNTER — Telehealth (INDEPENDENT_AMBULATORY_CARE_PROVIDER_SITE_OTHER): Payer: Medicare Other | Admitting: *Deleted

## 2019-12-17 ENCOUNTER — Other Ambulatory Visit: Payer: Self-pay

## 2019-12-17 VITALS — BP 154/82 | HR 80 | Temp 98.1°F | Ht 69.0 in | Wt 186.2 lb

## 2019-12-17 DIAGNOSIS — E1165 Type 2 diabetes mellitus with hyperglycemia: Secondary | ICD-10-CM | POA: Diagnosis not present

## 2019-12-17 DIAGNOSIS — E785 Hyperlipidemia, unspecified: Secondary | ICD-10-CM | POA: Diagnosis not present

## 2019-12-17 DIAGNOSIS — R300 Dysuria: Secondary | ICD-10-CM

## 2019-12-17 DIAGNOSIS — N3090 Cystitis, unspecified without hematuria: Secondary | ICD-10-CM

## 2019-12-17 LAB — GLUCOSE, POCT (MANUAL RESULT ENTRY): POC Glucose: 504 mg/dl — AB (ref 70–99)

## 2019-12-17 LAB — POCT GLYCOSYLATED HEMOGLOBIN (HGB A1C): Hemoglobin A1C: 11.6 % — AB (ref 4.0–5.6)

## 2019-12-17 MED ORDER — ROSUVASTATIN CALCIUM 20 MG PO TABS: 20.0000 mg | ORAL_TABLET | Freq: Every day | ORAL | 3 refills | Status: DC

## 2019-12-17 MED ORDER — INSULIN PEN NEEDLE 32G X 4 MM MISC: 1.0000 | 11 refills | Status: DC

## 2019-12-17 MED ORDER — LANTUS SOLOSTAR 100 UNIT/ML ~~LOC~~ SOPN: 16.0000 [IU] | PEN_INJECTOR | Freq: Every day | SUBCUTANEOUS | 11 refills | Status: DC

## 2019-12-17 MED ORDER — TRULICITY 1.5 MG/0.5ML ~~LOC~~ SOAJ: 1.0000 "pen " | SUBCUTANEOUS | 11 refills | Status: DC

## 2019-12-17 NOTE — Telephone Encounter (Signed)
Rebecka called and left a message this pm that she was seen 12/ 20/20 and tested + for UTI. States she still has the infection , it still seriously burning and needs help.  Asks for a call back. Cheryllynn Sarff,RN

## 2019-12-17 NOTE — Patient Instructions (Signed)
Start Lantus 16 units daily  Restart Trulicity 1.5 weekly  STOP Metformin     - Check sugar fasting and bedtime     Choose healthy, lower carb lower calorie snacks: toss salad, cooked vegetables, cottage cheese, peanut butter, low fat cheese / string cheese, lower sodium deli meat, tuna salad or chicken salad     HOW TO TREAT LOW BLOOD SUGARS (Blood sugar LESS THAN 70 MG/DL)  Please follow the RULE OF 15 for the treatment of hypoglycemia treatment (when your (blood sugars are less than 70 mg/dL)    STEP 1: Take 15 grams of carbohydrates when your blood sugar is low, which includes:   3-4 GLUCOSE TABS  OR  3-4 OZ OF JUICE OR REGULAR SODA OR  ONE TUBE OF GLUCOSE GEL     STEP 2: RECHECK blood sugar in 15 MINUTES STEP 3: If your blood sugar is still low at the 15 minute recheck --> then, go back to STEP 1 and treat AGAIN with another 15 grams of carbohydrates.

## 2019-12-17 NOTE — Progress Notes (Signed)
Name: Jessica [name]  MRN/ DOB: [id number], May 12, 1951   Age/ Sex: 68 y.o., female    PCP: Girtha Rm, NP-C   Reason for Endocrinology Evaluation: Type 2 Diabetes Mellitus     Date of Initial Endocrinology Visit: 12/17/2019     PATIENT IDENTIFIER: [name] is a 68 y.o. female with a past medical history of T2DM, HTN and Dyslipidemia. The patient presented for initial endocrinology clinic visit on 12/17/2019 for consultative assistance with her diabetes management.    HPI: [name] was    Diagnosed with T2DM in her 24's. Prior Medications tried/Intolerance: Intolerant to metformin.  Currently checking blood sugars 0x / day Hypoglycemia episodes : no      Hemoglobin A1c 7.0 % in 2019, peaking at 11.6% in 2020 Patient required assistance for hypoglycemia: no  Patient has required hospitalization within the last 1 year from hyper or hypoglycemia: no   In terms of diet, the patient eats 2 meals, a day,m snacks on chips   Moved from [place]   Metformin makes her sick   HOME DIABETES REGIMEN: Metformin 500 mg BID- takes 1 tablet daily  Trulicity 1.5 mg daily - out of the turlicity for 2 months    Statin: Yes ACE-I/ARB: Yes Prior Diabetic Education: yes    METER DOWNLOAD SUMMARY: Did not bring    DIABETIC COMPLICATIONS: Microvascular complications:    Denies: CKD, retinopathy , neuropathy   Last eye exam: Completed 2019  Macrovascular complications:    Denies: CAD, PVD, CVA   PAST HISTORY: Past Medical History:  Past Medical History:  Diagnosis Date  . Diabetes mellitus without complication ([place])   . GI bleed 09/08/2019   Past Surgical History:  Past Surgical History:  Procedure Laterality Date  . BUNIONECTOMY Right   . COLONOSCOPY N/A 09/11/2019   Procedure: COLONOSCOPY;  Surgeon: Virgel Manifold, MD;  Location: [facility] ENDOSCOPY;  Service: Endoscopy;  Laterality: N/A;  . UNILATERAL SALPINGECTOMY     due to ectopic pregnancy but she does not  recall if left or right      Social History:  reports that she has never smoked. She has never used smokeless tobacco. She reports previous alcohol use. She reports that she does not use drugs. Family History:  Family History  Problem Relation Age of Onset  . Colon cancer Mother   . Colon polyps Mother      HOME MEDICATIONS: Allergies as of 12/17/2019   No Known Allergies     Medication List       Accurate as of December 17, 2019  9:22 AM. If you have any questions, ask your nurse or doctor.        hydrALAZINE 25 MG tablet Commonly known as: APRESOLINE Take 25 mg by mouth daily.   metFORMIN 500 MG tablet Commonly known as: GLUCOPHAGE Take 1 tablet (500 mg total) by mouth 2 (two) times daily with a meal.   metoprolol tartrate 25 MG tablet Commonly known as: LOPRESSOR Take 1 tablet (25 mg total) by mouth 2 (two) times daily.   OneTouch Delica Lancets 43X Misc Use as directed to check BS three times daily   OneTouch Verio test strip Generic drug: glucose blood Use as instructed to check blood sugars up to 3 times per day   OneTouch Verio w/Device Kit Use to monitor blood sugar up to 3 times per day. DM ICD-10 E11.65 and I10   rosuvastatin 20 MG tablet Commonly known as: CRESTOR Take 1 tablet (20 mg  total) by mouth daily.   Trulicity 1.5 YC/1.4GY Sopn Generic drug: Dulaglutide Inject 1 pen into the skin every 7 (seven) days.        ALLERGIES: No Known Allergies   REVIEW OF SYSTEMS: A comprehensive ROS was conducted with the patient and is negative except as per HPI and below:  Review of Systems  Constitutional: Negative for chills and fever.  HENT: Negative for congestion and sore throat.   Respiratory: Negative for cough and shortness of breath.   Cardiovascular: Positive for chest pain and palpitations.       A single episode during hospitalization   Gastrointestinal: Positive for diarrhea and nausea.  Genitourinary: Positive for frequency.   Neurological: Negative for tingling and tremors.  Endo/Heme/Allergies: Positive for polydipsia.  Psychiatric/Behavioral: Negative for depression. The patient is not nervous/anxious.       OBJECTIVE:   VITAL SIGNS: BP (!) 154/82 (BP Location: Left Arm, Patient Position: Sitting, Cuff Size: Normal)   Pulse 80   Temp 98.1 F (36.7 C)   Ht 5' 9"  (1.753 m)   Wt 186 lb 3.2 oz (84.5 kg)   SpO2 97%   BMI 27.50 kg/m    PHYSICAL EXAM:  General: Pt appears well and is in NAD  HEENT: Eyes: External eye exam normal without stare, lid lag or exophthalmos.  EOM intact.  Neck: General: Supple without adenopathy or carotid bruits. Thyroid: Thyroid size normal.  No goiter or nodules appreciated. No thyroid bruit.  Lungs: Clear with good BS bilat with no rales, rhonchi, or wheezes  Heart: RRR with murmur  Abdomen: Normoactive bowel sounds, soft, nontender, without masses or organomegaly palpable  Extremities: Lower extremities - No pretibial edema.   Skin: Normal texture and temperature to palpation. No rash noted. No Acanthosis nigricans/skin tags. No lipohypertrophy.  Neuro: MS is good with appropriate affect, pt is alert and Ox3    DM foot exam: 12/17/2019  The skin of the feet is intact without sores or ulcerations. The pedal pulses are 2+ on right and 2+ on left. The sensation is intact to a screening 5.07, 10 gram monofilament bilaterally   DATA REVIEWED:  Lab Results  Component Value Date   HGBA1C 11.6 (A) 12/17/2019   HGBA1C 8.2 (H) 09/09/2019   HGBA1C 10.0 (H) 09/08/2019   Lab Results  Component Value Date   LDLCALC 160 (H) 10/08/2019   CREATININE 0.83 10/08/2019    Lab Results  Component Value Date   CHOL 263 (H) 10/08/2019   HDL 76 10/08/2019   LDLCALC 160 (H) 10/08/2019   TRIG 155 (H) 10/08/2019   CHOLHDL 3.5 10/08/2019       In -office BG 504 mg/dL after eating 3 cookies at 3 Am.  ASSESSMENT / PLAN / RECOMMENDATIONS:   1) Type 2 Diabetes Mellitus, Poorly  controlled, Without complications - Most recent A1c of 11.6 %. Goal A1c < 7.0 %.    - Poorly controled diabetes due to medication nonadherence and dietary indiscretions . Pt has not has trulicity in months due to insurance issues.  - She is S/P PRBC transfusion in 08/2019 , hence the low A1c at 8.2%  - I have discussed with the patient the pathophysiology of diabetes. We went over the natural progression of the disease. We talked about both insulin resistance and insulin deficiency. We stressed the importance of lifestyle changes including diet and exercise. I explained the complications associated with diabetes including retinopathy, nephropathy, neuropathy as well as increased risk of cardiovascular disease. We  went over the benefit seen with glycemic control.  I explained to the patient that diabetic patients are at higher than normal risk for amputations.   - She will be referred to our RD  - We discussed that based on her worsening A1c and in office BG of 504 mg/dL, insulin is the quickest thing that would bring it down. Pt agreed to starting basal insulin.  - She will pick her Trulicity today - She is intolerant to 1 tablet of Metformin , will discontinue - She is interested in CGM, she was directed to contact her insurance about coverage, as it seems she has been having issues with them    MEDICATIONS:  Stop Metformin   Restart Trulicity at 1.5 mg weekly   Start Lantus 16 units daily   EDUCATION / INSTRUCTIONS:  BG monitoring instructions: Patient is instructed to check her blood sugars 2 times a day, fasting and bedtime.  Call [place] [facility] if: BG persistently < 70 or > 300. . I reviewed the Rule of 15 for the treatment of hypoglycemia in detail with the patient. Literature supplied.   2) Diabetic complications:   Eye: Does not have known diabetic retinopathy.   Neuro/ Feet: Does not have known diabetic peripheral neuropathy.  Renal: Patient does not have  known baseline CKD. She is on an ACEI/ARB at present. Will need microalbumin/cr check   3) Lipids: Patient has crestor listed, but turns out she does not have it, hence the elevated LDL. This was refilled today    4) Hypertension: She is above goal of < 140/90 mmHg. Pt has been taking less metoprolol then prescribed, discussed the importance of compliance.      F/U in 3 months       Signed electronically by: Mack Guise, MD  [facility] Endocrinology  [facility] [address] [place], [place] [address] Phone: [phone] FAX: [phone]   CC: Girtha Rm, NP-C [address] [address] Phone: [phone]  Fax: [phone]    Return to Endocrinology clinic as below: Future Appointments  Date Time Provider [place]  01/15/2020  1:55 PM [facility] [place]  01/29/2020  4:30 PM Gardiner Barefoot, DPM [facility]

## 2019-12-18 MED ORDER — FLUCONAZOLE 150 MG PO TABS: ORAL_TABLET | ORAL | 0 refills | Status: DC

## 2019-12-18 MED ORDER — AMOXICILLIN 500 MG PO CAPS: 500.0000 mg | ORAL_CAPSULE | Freq: Three times a day (TID) | ORAL | 0 refills | Status: DC

## 2019-12-18 NOTE — Telephone Encounter (Signed)
Pt left additional message today @ 1004 stating that she has completed the antibiotic prescribed and still has significant burning with urination. She requests a new Rx. Pt has been referred to our office for Gyn care by her PCP and has appt scheduled on 01/15/20 for Well-woman exam. Chart reviewed and consult with Earlie Server, NP for plan of care. Rx for Amoxicillin sent to pharmacy as well as Diflucan for potential yeast infection. Pt was called and informed of plan of care. She voiced understanding.

## 2019-12-18 NOTE — Telephone Encounter (Signed)
Chart reviewed for nurse call. Agree with plan of care.   Virginia Rochester, NP 12/18/2019 3:04 PM

## 2019-12-28 ENCOUNTER — Telehealth: Payer: Self-pay | Admitting: Cardiology

## 2019-12-28 ENCOUNTER — Encounter: Payer: Self-pay | Admitting: Cardiology

## 2019-12-28 NOTE — Telephone Encounter (Signed)
Left message regarding Cardiac MRI ordered by Dr. Gardiner Rhyme scheduled 01/16/20 at 8:00 am at [facility]--arrival time is 7:15 am 1st floor admissions office.  Will also mail information to patient

## 2019-12-31 ENCOUNTER — Telehealth: Payer: Self-pay | Admitting: Cardiology

## 2019-12-31 NOTE — Telephone Encounter (Signed)
Donato Heinz, MD  11/26/2019 12:45 PM EST    Heart muscle is thickened. Recommend a cardiac MRI to evaluate why muscle is thickened (this may just be from high blood pressure, but could be from a genetic cause such as hypertrophic cardiomyopathy). She also has moderate leakage of her aortic valve. We can evaluate this better with a cardiac MRI.  Pt notified she will await scheduling call for the MRI

## 2019-12-31 NOTE — Telephone Encounter (Signed)
  Patient would like the nurse to call her and go over her echo results again with her. She is not sure what she was told.

## 2020-01-15 ENCOUNTER — Telehealth (HOSPITAL_COMMUNITY): Payer: Self-pay | Admitting: Emergency Medicine

## 2020-01-15 ENCOUNTER — Encounter: Payer: Self-pay | Admitting: Student

## 2020-01-15 ENCOUNTER — Other Ambulatory Visit (HOSPITAL_COMMUNITY)
Admission: RE | Admit: 2020-01-15 | Discharge: 2020-01-15 | Disposition: A | Discharge status: Home / Self Care | Payer: Medicare Other | Source: Ambulatory Visit | Attending: Student | Admitting: Student

## 2020-01-15 ENCOUNTER — Ambulatory Visit (INDEPENDENT_AMBULATORY_CARE_PROVIDER_SITE_OTHER): Payer: Medicare Other | Admitting: Student

## 2020-01-15 ENCOUNTER — Other Ambulatory Visit: Payer: Self-pay

## 2020-01-15 VITALS — BP 138/83 | HR 89 | Wt 188.2 lb

## 2020-01-15 DIAGNOSIS — Z1231 Encounter for screening mammogram for malignant neoplasm of breast: Secondary | ICD-10-CM

## 2020-01-15 DIAGNOSIS — R109 Unspecified abdominal pain: Secondary | ICD-10-CM | POA: Diagnosis not present

## 2020-01-15 DIAGNOSIS — Z01419 Encounter for gynecological examination (general) (routine) without abnormal findings: Secondary | ICD-10-CM

## 2020-01-15 DIAGNOSIS — Z1151 Encounter for screening for human papillomavirus (HPV): Secondary | ICD-10-CM | POA: Insufficient documentation

## 2020-01-15 NOTE — Progress Notes (Signed)
History:  [name] is a 69 y.o. [id number] who presents to clinic today for routine gyn exam and complaints of pelvic fullness and suprapubic pressure. She denies urinary frequency, dysuria, abnormal vaginal discharge. She reports a history of fibroids. She is post-menopausal and denies vaginal bleeding.  She is establishing care at [facility] after relocating from [place], [place].    She has a Type 2 DM and CHTN, and sees a PCP for management.   The following portions of the patient's history were reviewed and updated as appropriate: allergies, current medications, family history, past medical history, social history, past surgical history and problem list.  Review of Systems:  Review of Systems  Constitutional: Negative.   HENT: Negative.   Respiratory: Negative.   Cardiovascular: Negative.   Genitourinary: Negative.   Skin: Negative.   Neurological: Negative.       Objective:  Physical Exam BP 138/83   Pulse 89   Wt 188 lb 3.2 oz (85.4 kg)   BMI 27.79 kg/m  Physical Exam  Constitutional: She is oriented to person, place, and time. She appears well-developed.  HENT:  Head: Normocephalic.  GI: Soft.  Genitourinary:    Genitourinary Comments: NEFG; no blood or discharge in the vagina. No lesions on cervix or vaginal walls; uterus feels enlarged and slightly tender.    Musculoskeletal:        General: Normal range of motion.     Cervical back: Normal range of motion.  Neurological: She is alert and oriented to person, place, and time.  Skin: Skin is warm and dry.  Breast exam: benign, no masses or lumps palpated. Breasts are symmetrical and non-tender.     Labs and Imaging No results found for this or any previous visit (from the past 24 hour(s)).  No results found.   Assessment & Plan:  1. Encounter for screening mammogram for breast cancer  - MM 3D SCREEN BREAST BILATERAL; Future  2. Abdominal pain, unspecified abdominal location -Possible that patient has fibroids,  will send for [place] and then follow-up with MD.  - US Pelvis Complete; Future  3. Well woman exam with routine gynecological exam -Based on results today, will follow ASCCP guidelines for cervical cancer screening for women over 33.  - [facility]   [place], [place] 01/16/2020 7:03 AM

## 2020-01-15 NOTE — Telephone Encounter (Signed)
Pt unaware of upcoming appt made for tomorrow. Wishes to reschedule.   Message sent to ordering MD and scheduling.  Marchia Bond RN Navigator Cardiac Imaging [facility] Heart and Vascular Services [phone] Office  [phone] Cell

## 2020-01-16 ENCOUNTER — Ambulatory Visit (HOSPITAL_COMMUNITY): Admission: RE | Admit: 2020-01-16 | Payer: Medicare Other | Source: Ambulatory Visit

## 2020-01-16 ENCOUNTER — Telehealth: Payer: Self-pay | Admitting: Cardiology

## 2020-01-16 LAB — CYTOLOGY - PAP
Comment: NEGATIVE
Diagnosis: NEGATIVE
High risk HPV: NEGATIVE

## 2020-01-16 NOTE — Telephone Encounter (Signed)
Left message for patient to call and reschedule Cardiac MRI

## 2020-01-16 NOTE — Telephone Encounter (Signed)
Good morning.  I called the patient on 12/28/19 and left a message regarding this appointment and also mailed an instruction letter on the same date.  I will call the patient and reschedule.

## 2020-01-23 ENCOUNTER — Other Ambulatory Visit: Payer: Self-pay

## 2020-01-23 ENCOUNTER — Ambulatory Visit (HOSPITAL_COMMUNITY)
Admission: RE | Admit: 2020-01-23 | Discharge: 2020-01-23 | Disposition: A | Discharge status: Home / Self Care | Payer: Medicare Other | Source: Ambulatory Visit | Attending: Student | Admitting: Student

## 2020-01-23 DIAGNOSIS — R109 Unspecified abdominal pain: Secondary | ICD-10-CM | POA: Insufficient documentation

## 2020-01-28 ENCOUNTER — Ambulatory Visit: Payer: Medicare Other | Attending: Internal Medicine

## 2020-01-28 DIAGNOSIS — Z23 Encounter for immunization: Secondary | ICD-10-CM

## 2020-01-29 ENCOUNTER — Other Ambulatory Visit: Payer: Self-pay

## 2020-01-29 ENCOUNTER — Ambulatory Visit (INDEPENDENT_AMBULATORY_CARE_PROVIDER_SITE_OTHER): Payer: Medicare Other | Admitting: Podiatry

## 2020-01-29 ENCOUNTER — Encounter: Payer: Self-pay | Admitting: Podiatry

## 2020-01-29 VITALS — BP 156/94 | HR 92

## 2020-01-29 DIAGNOSIS — E1165 Type 2 diabetes mellitus with hyperglycemia: Secondary | ICD-10-CM | POA: Diagnosis not present

## 2020-01-29 NOTE — Progress Notes (Signed)
This patient presents to the office for a  diabetic foot exam.  She was referred to this office by Dr.  Jenne Campus..  This patient  says there  is  no pain and discomfort in her feet.  This patient says there are long thick painful nails.  Patient has no history of infection or drainage from both feet.   This patient presents  to the office today for  a foot evaluation due to history of  diabetes.  General Appearance  Alert, conversant and in no acute stress.  Vascular  Dorsalis pedis and posterior tibial  pulses are palpable  bilaterally.  Capillary return is within normal limits  bilaterally. Temperature is within normal limits  bilaterally.  Neurologic  Senn-Weinstein monofilament wire test within normal limits  bilaterally. Muscle power within normal limits bilaterally.  Nails Thick disfigured discolored nails with subungual debris  from hallux to fifth toes bilaterally. No evidence of bacterial infection or drainage bilaterally.  Orthopedic  No limitations of motion of motion feet .  No crepitus or effusions noted.  No bony pathology or digital deformities noted. HAV  B/L.  Skin  normotropic skin with no porokeratosis noted bilaterally.  No signs of infections or ulcers noted.     Onychomycosis  Diabetes with no foot complications  IE  Debride nails x 10.  A diabetic foot exam was performed and there is no evidence of any vascular or neurologic pathology.   RTC 3 months.   Gardiner Barefoot DPM

## 2020-01-30 ENCOUNTER — Ambulatory Visit (INDEPENDENT_AMBULATORY_CARE_PROVIDER_SITE_OTHER): Payer: Medicare Other | Admitting: Family Medicine

## 2020-01-30 ENCOUNTER — Encounter: Payer: Self-pay | Admitting: Family Medicine

## 2020-01-30 VITALS — BP 142/87 | HR 85 | Wt 187.7 lb

## 2020-01-30 DIAGNOSIS — N814 Uterovaginal prolapse, unspecified: Secondary | ICD-10-CM | POA: Insufficient documentation

## 2020-01-30 NOTE — Progress Notes (Signed)
   Subjective:    Patient ID: Jessica [name] is a 69 y.o. female presenting with Follow-up  on 01/30/2020  HPI: Notes a feeling of swelling down below when she uses the bathroom. Previously evaluated by CNM who understood abdominal bloating. U/s show small fibroids and postmenopausal ovaries. Nml endometrial stripe. She has no bleeding complaints. Has several medical problems including DM (last A1C is 11.6 12/28/20200 and HTN. She is not sexually active.  Review of Systems  Constitutional: Negative for chills and fever.  Respiratory: Negative for shortness of breath.   Cardiovascular: Negative for chest pain.  Gastrointestinal: Negative for abdominal pain, nausea and vomiting.  Genitourinary: Negative for dysuria.  Skin: Negative for rash.      Objective:    BP (!) 142/87   Pulse 85   Wt 187 lb 11.2 oz (85.1 kg)   BMI 27.72 kg/m  Physical Exam Constitutional:      General: She is not in acute distress.    Appearance: She is well-developed.  HENT:     Head: Normocephalic and atraumatic.  Eyes:     General: No scleral icterus. Cardiovascular:     Rate and Rhythm: Normal rate.  Pulmonary:     Effort: Pulmonary effort is normal.  Abdominal:     Palpations: Abdomen is soft.  Genitourinary:    Comments: BUS normal, vagina is pale and atrophic, cervix is parous without lesion, uterus is small and anteverted, no adnexal mass or tenderness. Good anterior support and posterior as wel while recumbent.. No uterine prolapse. While standing a cystocele is noted Grade 2+-3 at or just beyond introitus  Musculoskeletal:     Cervical back: Neck supple.  Skin:    General: Skin is warm and dry.  Neurological:     Mental Status: She is alert and oriented to person, place, and time.         Assessment & Plan:   Problem List Items Addressed This Visit      Unprioritized   Cystocele with prolapse - Primary    Discussed pessary use and surgery. She is hesitant about surgery but also  about wearing something all the time. Given written information. She will consider all of this, talk with Uro/Gyn and decide how she wants to proceed.      Relevant Orders   Ambulatory referral to Urogynecology     Problem List Items Addressed This Visit      Unprioritized   Cystocele with prolapse - Primary    Discussed pessary use and surgery. She is hesitant about surgery but also about wearing something all the time. Given written information. She will consider all of this, talk with Uro/Gyn and decide how she wants to proceed.      Relevant Orders   Ambulatory referral to Urogynecology      Total face-to-face time with patient: 30 minutes. Over 50% of encounter was spent on counseling and coordination of care. Return in about 3 months (around 04/28/2020).  [name] 01/30/2020 3:21 PM

## 2020-01-30 NOTE — Progress Notes (Signed)
[facility] to begin referral process. New pt appt made for 03/26/20 at 7 at [facility] location. [place] office notified to fax needed information. Called pt; VM left with new appt date and time. Encouraged pt to call our office with any questions.   Apolonio Schneiders RN 01/30/20

## 2020-01-30 NOTE — Patient Instructions (Signed)

## 2020-01-30 NOTE — Assessment & Plan Note (Signed)
Discussed pessary use and surgery. She is hesitant about surgery but also about wearing something all the time. Given written information. She will consider all of this, talk with Uro/Gyn and decide how she wants to proceed.

## 2020-02-04 ENCOUNTER — Telehealth: Payer: Self-pay | Admitting: *Deleted

## 2020-02-04 ENCOUNTER — Encounter: Payer: Self-pay | Admitting: Cardiology

## 2020-02-04 NOTE — Telephone Encounter (Signed)
Left message regarding appointment for Cardiac MRI scheduled Tuesday 03/04/20 at 12:00pm at [facility]---arrival time is 11:15 am 1st floor admissions office for check in--will mail information to patient and it is also available in My Chart.

## 2020-02-21 ENCOUNTER — Ambulatory Visit: Payer: Medicare Other | Attending: Internal Medicine

## 2020-02-21 DIAGNOSIS — Z23 Encounter for immunization: Secondary | ICD-10-CM | POA: Insufficient documentation

## 2020-02-21 NOTE — Progress Notes (Signed)
   Covid-19 Vaccination Clinic  Name:  Jessica [name]    MRN: [id number] DOB: 1951-03-18  02/21/2020  [name] was observed post Covid-19 immunization for 15 minutes without incident. She was provided with Vaccine Information Sheet and instruction to access the V-Safe system.   [name] was instructed to call 911 with any severe reactions post vaccine: [place] Kitchen Difficulty breathing  . Swelling of face and throat  . A fast heartbeat  . A bad rash all over body  . Dizziness and weakness   Immunizations Administered    Name Date Dose VIS Date Route   Pfizer COVID-19 Vaccine 02/21/2020  5:55 PM 0.3 mL 11/30/2019 Intramuscular   Manufacturer: [place]   Lot: [id number]   [place]: [id number]

## 2020-03-03 ENCOUNTER — Encounter (HOSPITAL_COMMUNITY): Payer: Self-pay

## 2020-03-03 ENCOUNTER — Telehealth (HOSPITAL_COMMUNITY): Payer: Self-pay | Admitting: Emergency Medicine

## 2020-03-03 NOTE — Telephone Encounter (Signed)
Left message on voicemail with name and callback number Javanni Maring RN Navigator Cardiac Imaging [place] [facility] [phone] Office [phone] Cell  

## 2020-03-04 ENCOUNTER — Ambulatory Visit (HOSPITAL_COMMUNITY)
Admission: RE | Admit: 2020-03-04 | Discharge: 2020-03-04 | Disposition: A | Discharge status: Home / Self Care | Payer: Medicare Other | Source: Ambulatory Visit | Attending: Cardiology | Admitting: Cardiology

## 2020-03-04 ENCOUNTER — Other Ambulatory Visit: Payer: Self-pay

## 2020-03-04 DIAGNOSIS — I422 Other hypertrophic cardiomyopathy: Secondary | ICD-10-CM | POA: Insufficient documentation

## 2020-03-04 LAB — CREATININE, SERUM
Creatinine, Ser: 0.98 mg/dL (ref 0.44–1.00)
GFR calc Af Amer: >60 mL/min (ref >60)
GFR calc non Af Amer: 60 mL/min — ABNORMAL LOW (ref >60)

## 2020-03-04 MED ORDER — GADOBUTROL 1 MMOL/ML IV SOLN
10.0000 mL | Freq: Once | INTRAVENOUS | Status: AC | PRN
  Administered 2020-03-04: 10 mL via INTRAVENOUS

## 2020-03-10 NOTE — Progress Notes (Signed)
Cardiology Office Note:    Date:  03/20/2020   ID:  Jessica [name], DOB 01/05/1951, MRN [id number]  PCP:  Patient, No Pcp Per  Cardiologist:  No primary care provider on file.  Electrophysiologist:  None   Referring MD: No ref. provider found   Chief Complaint  Patient presents with  . Palpitations     History of Present Illness:    Jessica [name] is a 69 y.o. female with a hx of type 2 diabetes, hypertension who presents for follow-up.  She was referred by Dr. Chapman Fitch for an evaluation of palpitations.  She was admitted to hospital  from 09/07/2022 through 09/12/2019 for GI bleed.  Hgb was down to 6.7 on presentation.  Colonoscopy showed diverticulosis.  She reports that she had an abnormal heart rhythm while admitted and was started on metoprolol.  States that heart was racing.  She states that since she got out of the hospital she has had more episodes where she feels like her heart is racing.  Occurs for 2 to 3 minutes and resolves.  She has a history of these episodes but had not had it for years.  Now occurring 1-2 times per week.  Also reports has been having worsening dyspnea on exertion.  States that she will walk 2 miles per day but if she tries to extend her walks she feels significant shortness of breath.  Denies any exertional chest pain.  Father died of MI at 102.  Sister had stents in early 73s.  Quit smoking 20 years ago.    TTE on 11/23/2019 showed moderate asymmetric basal septal hypertrophy, normal LV systolic function, moderate AI, normal RV function.  CMR on 03/04/2020 showed asymmetric septal hypertrophy measuring up to 15 mm consistent with HCM, patchy LGE in basal septum and RV insertion site (6% of total myocardial mass), normal LV/RV size and systolic function, trivial aortic regurgitation.  Cardiac monitor was ordered but has not been done.  Since last clinic visit, she reports that she continues to have palpitations.  Denies any lightheadedness or chest pain.  She recently started  weight watchers, has lost 2 pounds.  She is walking 2 miles per day.  She reports some dyspnea on exertion, but denies any chest pain.   Past Medical History:  Diagnosis Date  . Diabetes mellitus without complication ([place])   . GI bleed 09/08/2019    Past Surgical History:  Procedure Laterality Date  . BUNIONECTOMY Right   . COLONOSCOPY N/A 09/11/2019   Procedure: COLONOSCOPY;  Surgeon: Virgel Manifold, MD;  Location[facility] ENDOSCOPY;  Service: Endoscopy;  Laterality: N/A;  . UNILATERAL SALPINGECTOMY     due to ectopic pregnancy but she does not recall if left or right    Current Medications: Current Meds  Medication Sig  . Blood Glucose Monitoring Suppl (ONETOUCH VERIO) w/Device KIT Use to monitor blood sugar up to 3 times per day. DM ICD-10 E11.65 and I10  . Dulaglutide (TRULICITY) 5.91 MB/8.4YK SOPN Trulicity 5.99 JT/7.0 mL subcutaneous pen injector  . glucose blood (PRECISION QID TEST) test strip Use as instructed to check blood sugars up to 3 times per day  . hydrALAZINE (APRESOLINE) 25 MG tablet Take 25 mg by mouth daily.  . hydrochlorothiazide (HYDRODIURIL) 25 MG tablet hydrochlorothiazide 25 mg tablet  . insulin aspart (NOVOLOG FLEXPEN) 100 UNIT/ML FlexPen Novolog Flexpen U-100 Insulin aspart 100 unit/mL (3 mL) subcutaneous  . insulin detemir (LEVEMIR FLEXTOUCH) 100 UNIT/ML FlexPen Levemir FlexTouch U-100 Insulin 100 unit/mL (3 mL) subcutaneous  pen  . insulin glargine (LANTUS SOLOSTAR) 100 UNIT/ML Solostar Pen Inject into the skin.  . Insulin Pen Needle 32G X 4 MM MISC 1 Device by Does not apply route as directed.  . Lancets Misc. MISC Use as directed to check BS three times daily  . lisinopril (ZESTRIL) 20 MG tablet Take 20 mg by mouth daily.  . mometasone (ELOCON) 0.1 % cream mometasone 0.1 % topical cream  . OneTouch Delica Lancets 40N MISC Use as directed to check BS three times daily     Allergies:   Patient has no known allergies.   Social History    Socioeconomic History  . Marital status: Single    Spouse name: Not on file  . Number of children: Not on file  . Years of education: Not on file  . Highest education level: Not on file  Occupational History  . Not on file  Tobacco Use  . Smoking status: Never Smoker  . Smokeless tobacco: Never Used  Substance and Sexual Activity  . Alcohol use: Not Currently  . Drug use: Never  . Sexual activity: Not on file  Other Topics Concern  . Not on file  Social History Narrative  . Not on file   Social Determinants of Health   Financial Resource Strain:   . Difficulty of Paying Living Expenses:   Food Insecurity:   . Worried About Charity fundraiser in the Last Year:   . Arboriculturist in the Last Year:   Transportation Needs:   . Film/video editor (Medical):   Marland Kitchen Lack of Transportation (Non-Medical):   Physical Activity:   . Days of Exercise per Week:   . Minutes of Exercise per Session:   Stress:   . Feeling of Stress :   Social Connections:   . Frequency of Communication with Friends and Family:   . Frequency of Social Gatherings with Friends and Family:   . Attends Religious Services:   . Active Member of Clubs or Organizations:   . Attends Archivist Meetings:   Marland Kitchen Marital Status:   Former smoker  Family History: The patient's family history includes Colon cancer in her mother; Colon polyps in her mother.  ROS:   Please see the history of present illness.     All other systems reviewed and are negative.  EKGs/Labs/Other Studies Reviewed:    The following studies were reviewed today:   EKG:  EKG is.ordered today.  The ekg ordered most recently demonstrates normal sinus rhythm, rate 79, no ST/T abnormalities  TTE 11/23/19: 1. Left ventricular ejection fraction, by visual estimation, is 60 to  65%. The left ventricle has normal function. There is moderately increased  left ventricular hypertrophy.  2. Asymmetric basal septal hypertrophy  measuring up to 41m (161min  posterior wall)  3. The average left ventricular global longitudinal strain is -18.3 %.  4. Left ventricular diastolic parameters are indeterminate.  5. Global right ventricle has normal systolic function.The right  ventricular size is normal. No increase in right ventricular wall  thickness.  6. Left atrial size was normal.  7. Right atrial size was normal.  8. The mitral valve is normal in structure. No evidence of mitral valve  regurgitation.  9. The tricuspid valve is normal in structure. Tricuspid valve  regurgitation is trivial.  10. The aortic valve is tricuspid. Aortic valve regurgitation is moderate.  No evidence of aortic valve sclerosis or stenosis.  11. The pulmonic valve was grossly  normal. Pulmonic valve regurgitation is  mild.  12. The inferior vena cava is normal in size with greater than 50%  respiratory variability, suggesting right atrial pressure of 3 mmHg.   CMR 03/04/20: 1. Asymmetric hypertrophy measuring up to 96m in basal septum (795min posterior wall), consistent with hypertrophic cardiomyopathy 2. Patchy LGE in basal septum and RV insertion site, consistent with HCM. LGE accounts for 6% of total myocardial mass 3.  Normal LV size and systolic function (EF 6360%4.  Normal RV size and systolic function (EF 5545% Recent Labs: 09/09/2019: Magnesium 1.9 10/08/2019: ALT 14; BUN 7; Hemoglobin 11.4; Platelets 269; Potassium 4.3; Sodium 140; TSH 1.110 03/04/2020: Creatinine, Ser 0.98  Recent Lipid Panel    Component Value Date/Time   CHOL 263 (H) 10/08/2019 1331   TRIG 155 (H) 10/08/2019 1331   HDL 76 10/08/2019 1331   CHOLHDL 3.5 10/08/2019 1331   LDLCALC 160 (H) 10/08/2019 1331    Physical Exam:    VS:  BP 130/90   Pulse 89   Temp (!) 97.1 F (36.2 C)   Ht 5' 9"  (1.753 m)   Wt 191 lb (86.6 kg)   SpO2 98%   BMI 28.21 kg/m     Wt Readings from Last 3 Encounters:  03/14/20 191 lb (86.6 kg)  01/30/20 187 lb  11.2 oz (85.1 kg)  01/15/20 188 lb 3.2 oz (85.4 kg)     GEN:  Well nourished, well developed in no acute distress HEENT: Normal NECK: No JVD; No carotid bruits LYMPHATICS: No lymphadenopathy CARDIAC: RRR, no murmurs, rubs, gallops RESPIRATORY:  Clear to auscultation without rales, wheezing or rhonchi  ABDOMEN: Soft, non-tender, non-distended MUSCULOSKELETAL:  No edema; No deformity  SKIN: Warm and dry NEUROLOGIC:  Alert and oriented x 3 PSYCHIATRIC:  Normal affect   ASSESSMENT:    1. Hypertrophic cardiomyopathy (HCC)   2. Palpitation   3. Essential hypertension   4. Hyperlipidemia, unspecified hyperlipidemia type    PLAN:    Hypertrophic cardiomyopathy: CMR on 03/04/2020 showed asymmetric septal hypertrophy measuring up to 15 mm consistent with HCM, patchy LGE in basal septum and RV insertion site (6% of total myocardial mass).  No LVOT obstruction on TTE.  Will follow up results of Zio patch as below.  Recommended first-degree family members be screened for HCM.  Palpitations: Description concerning for arrhythmia.  [facility], but I cannot find documentation of this.  Zio patch x1 week pending  Hypertension: on metoprolol 25 mg twice daily and hydralazine 25 mg 3 times daily  Hyperlipidemia: On rosuvastatin 20 mg daily.  Type 2 diabetes: A1c 11.6 on 12/09/2019.  On insulin  RTC in 3 months  Medication Adjustments/Labs and Tests Ordered: Current medicines are reviewed at length with the patient today.  Concerns regarding medicines are outlined above.  No orders of the defined types were placed in this encounter.  No orders of the defined types were placed in this encounter.   Patient Instructions  Medication Instructions:  Your physician recommends that you continue on your current medications as directed. Please refer to the Current Medication list given to you today.  *If you need a refill on your  cardiac medications before your next appointment, please call your pharmacy*   Lab Work: NONE  Testing/Procedures: Wear the monitor for 1 week that you have at home.   Follow-Up: [facility].  As  part of our continuing mission to provide you with exceptional heart care, we have created designated Provider Care Teams.  These Care Teams include your primary Cardiologist (physician) and Advanced Practice Providers (APPs -  Physician Assistants and Nurse Practitioners) who all work together to provide you with the care you need, when you need it.  We recommend signing up for the patient portal called "MyChart".  Sign up information is provided on this After Visit Summary.  MyChart is used to connect with patients for Virtual Visits (Telemedicine).  Patients are able to view lab/test results, encounter notes, upcoming appointments, etc.  Non-urgent messages can be sent to your provider as well.   To learn more about what you can do with MyChart, go to [email or web address].    Your next appointment:   3 month(s)  The format for your next appointment:   Either In Person or Virtual  Provider:   Oswaldo Milian, MD       Signed, Donato Heinz, MD  03/20/2020 9:44 PM    [place]

## 2020-03-14 ENCOUNTER — Encounter: Payer: Self-pay | Admitting: Cardiology

## 2020-03-14 ENCOUNTER — Ambulatory Visit (INDEPENDENT_AMBULATORY_CARE_PROVIDER_SITE_OTHER): Payer: Medicare Other | Admitting: Cardiology

## 2020-03-14 ENCOUNTER — Other Ambulatory Visit: Payer: Self-pay

## 2020-03-14 VITALS — BP 130/90 | HR 89 | Temp 97.1°F | Ht 69.0 in | Wt 191.0 lb

## 2020-03-14 DIAGNOSIS — I422 Other hypertrophic cardiomyopathy: Secondary | ICD-10-CM

## 2020-03-14 DIAGNOSIS — I10 Essential (primary) hypertension: Secondary | ICD-10-CM | POA: Diagnosis not present

## 2020-03-14 DIAGNOSIS — E785 Hyperlipidemia, unspecified: Secondary | ICD-10-CM | POA: Diagnosis not present

## 2020-03-14 DIAGNOSIS — R002 Palpitations: Secondary | ICD-10-CM | POA: Diagnosis not present

## 2020-03-14 NOTE — Patient Instructions (Signed)
Medication Instructions:  Your physician recommends that you continue on your current medications as directed. Please refer to the Current Medication list given to you today.  *If you need a refill on your cardiac medications before your next appointment, please call your pharmacy*   Lab Work: NONE  Testing/Procedures: Wear the monitor for 1 week that you have at home.   Follow-Up: At [facility], you and your health needs are our priority.  As part of our continuing mission to provide you with exceptional heart care, we have created designated Provider Care Teams.  These Care Teams include your primary Cardiologist (physician) and Advanced Practice Providers (APPs -  Physician Assistants and Nurse Practitioners) who all work together to provide you with the care you need, when you need it.  We recommend signing up for the patient portal called "MyChart".  Sign up information is provided on this After Visit Summary.  MyChart is used to connect with patients for Virtual Visits (Telemedicine).  Patients are able to view lab/test results, encounter notes, upcoming appointments, etc.  Non-urgent messages can be sent to your provider as well.   To learn more about what you can do with MyChart, go to [email or web address].    Your next appointment:   3 month(s)  The format for your next appointment:   Either In Person or Virtual  Provider:   Oswaldo Milian, MD

## 2020-03-26 ENCOUNTER — Ambulatory Visit: Payer: Medicare Other | Admitting: Internal Medicine

## 2020-04-04 ENCOUNTER — Other Ambulatory Visit: Payer: Self-pay

## 2020-04-04 ENCOUNTER — Ambulatory Visit
Admission: RE | Admit: 2020-04-04 | Discharge: 2020-04-04 | Disposition: A | Discharge status: Home / Self Care | Payer: Medicare Other | Source: Ambulatory Visit | Attending: Student | Admitting: Student

## 2020-04-04 DIAGNOSIS — Z1231 Encounter for screening mammogram for malignant neoplasm of breast: Secondary | ICD-10-CM

## 2020-04-09 ENCOUNTER — Encounter: Payer: Self-pay | Admitting: Internal Medicine

## 2020-04-09 ENCOUNTER — Other Ambulatory Visit: Payer: Self-pay

## 2020-04-09 ENCOUNTER — Ambulatory Visit (INDEPENDENT_AMBULATORY_CARE_PROVIDER_SITE_OTHER): Payer: Medicare Other | Admitting: Internal Medicine

## 2020-04-09 VITALS — BP 132/86 | HR 87 | Temp 98.4°F | Ht 69.0 in | Wt 187.8 lb

## 2020-04-09 DIAGNOSIS — E1165 Type 2 diabetes mellitus with hyperglycemia: Secondary | ICD-10-CM | POA: Diagnosis not present

## 2020-04-09 LAB — POCT GLYCOSYLATED HEMOGLOBIN (HGB A1C): Hemoglobin A1C: 8.1 % — AB (ref 4.0–5.6)

## 2020-04-09 MED ORDER — LANTUS SOLOSTAR 100 UNIT/ML ~~LOC~~ SOPN: 20.0000 [IU] | PEN_INJECTOR | Freq: Every day | SUBCUTANEOUS | 6 refills | Status: DC

## 2020-04-09 MED ORDER — FREESTYLE LIBRE 2 READER DEVI: 1.0000 | 0 refills | Status: DC

## 2020-04-09 MED ORDER — FREESTYLE LIBRE 2 SENSOR MISC: 1.0000 | 11 refills | Status: DC

## 2020-04-09 MED ORDER — TRULICITY 1.5 MG/0.5ML ~~LOC~~ SOAJ: 1.5000 mg | SUBCUTANEOUS | 6 refills | Status: DC

## 2020-04-09 NOTE — Patient Instructions (Signed)
-   Increase Lantus to 20 units daily  - Continue Trulicity 1.5 weekly         HOW TO TREAT LOW BLOOD SUGARS (Blood sugar LESS THAN 70 MG/DL)  Please follow the RULE OF 15 for the treatment of hypoglycemia treatment (when your (blood sugars are less than 70 mg/dL)    STEP 1: Take 15 grams of carbohydrates when your blood sugar is low, which includes:   3-4 GLUCOSE TABS  OR  3-4 OZ OF JUICE OR REGULAR SODA OR  ONE TUBE OF GLUCOSE GEL     STEP 2: RECHECK blood sugar in 15 MINUTES STEP 3: If your blood sugar is still low at the 15 minute recheck --> then, go back to STEP 1 and treat AGAIN with another 15 grams of carbohydrates.

## 2020-04-09 NOTE — Progress Notes (Signed)
Name: Jessica [name]  Age/ Sex: 69 y.o., female   MRN/ DOB: [id number], 1951-06-20     PCP: Patient, No Pcp Per   Reason for Endocrinology Evaluation: Type 2 Diabetes Mellitus  Initial Endocrine Consultative Visit: 12/17/2019    PATIENT IDENTIFIER: Jessica [name] is a 69 y.o. female with a past medical history of T2DM, HTN and Dyslipidemia . The patient has followed with Endocrinology clinic since 12/17/2019 for consultative assistance with management of her diabetes.  DIABETIC HISTORY:  Jessica [name] was diagnosed with T2DM  In her 63's. She is intolerant to Metformin. Her hemoglobin A1c has ranged from 7.0 % in 2019, peaking at 11.6% in 2020  On her initial visit to our clinic she had    SUBJECTIVE:   During the last visit (12/17/2019): A1c 11.6% . Restarted trulicity   Today (06/25/8674): Jessica [name] is here for a follow up on diabetes management. She checks her blood sugars 1 times daily. The patient has not had hypoglycemic episodes since the last clinic visit.  ROS: As per HPI and as detailed below: Review of Systems  Gastrointestinal: Negative for diarrhea and nausea.      HOME DIABETES REGIMEN:   Trulicity 1.5 mg weekly   Lantus 16 units daily       METER DOWNLOAD SUMMARY: Date range evaluated: 4/8-4/21/2021 Fingerstick Blood Glucose Tests = 5 Average Number Tests/Day = 0.4 Overall Mean FS Glucose = 244    BG Ranges: Low = 205 High = 311   Hypoglycemic Events/30 Days: BG < 50 = 0 Episodes of symptomatic severe hypoglycemia = 0   DIABETIC COMPLICATIONS: Microvascular complications:    Denies: CKD, retinopathy , neuropathy   Last eye exam: Completed 2019  Macrovascular complications:    Denies: CAD, PVD, CVA   HISTORY:  Past Medical History:  Past Medical History:  Diagnosis Date  . Diabetes mellitus without complication ([place])   . GI bleed 09/08/2019   Past Surgical History:  Past Surgical History:  Procedure Laterality Date  .  BUNIONECTOMY Right   . COLONOSCOPY N/A 09/11/2019   Procedure: COLONOSCOPY;  Surgeon: Virgel Manifold, MD;  Location[facility] ENDOSCOPY;  Service: Endoscopy;  Laterality: N/A;  . UNILATERAL SALPINGECTOMY     due to ectopic pregnancy but she does not recall if left or right    Social History:  reports that she has never smoked. She has never used smokeless tobacco. She reports previous alcohol use. She reports that she does not use drugs. Family History:  Family History  Problem Relation Age of Onset  . Colon cancer Mother   . Colon polyps Mother      HOME MEDICATIONS: Allergies as of 04/09/2020   No Known Allergies     Medication List       Accurate as of April 09, 2020  2:06 PM. If you have any questions, ask your nurse or doctor.        hydrALAZINE 25 MG tablet Commonly known as: APRESOLINE Take 25 mg by mouth daily.   hydrochlorothiazide 25 MG tablet Commonly known as: HYDRODIURIL hydrochlorothiazide 25 mg tablet   Insulin Pen Needle 32G X 4 MM Misc 1 Device by Does not apply route as directed.   Lancets Misc. Misc Use as directed to check BS three times daily   Lantus SoloStar 100 UNIT/ML Solostar Pen Generic drug: insulin glargine Inject into the skin.   Levemir FlexTouch 100 UNIT/ML FlexPen Generic drug: insulin detemir 16 Units.   lisinopril 20 MG tablet  Commonly known as: ZESTRIL Take 20 mg by mouth daily.   mometasone 0.1 % cream Commonly known as: ELOCON mometasone 0.1 % topical cream   NovoLOG FlexPen 100 UNIT/ML FlexPen Generic drug: insulin aspart Novolog Flexpen U-100 Insulin aspart 100 unit/mL (3 mL) subcutaneous   OneTouch Delica Lancets 34K Misc Use as directed to check BS three times daily   OneTouch Verio w/Device Kit Use to monitor blood sugar up to 3 times per day. DM ICD-10 E11.65 and I10   Precision QID Test test strip Generic drug: glucose blood Use as instructed to check blood sugars up to 3 times per day   Trulicity  3.52 YE/1.8HT Sopn Generic drug: Dulaglutide Trulicity 0.93 JP/2.1 mL subcutaneous pen injector        OBJECTIVE:   Vital Signs: BP 132/86 (BP Location: Left Arm, Patient Position: Sitting, Cuff Size: Normal)   Pulse 87   Temp 98.4 F (36.9 C)   Ht 5' 9"  (1.753 m)   Wt 187 lb 12.8 oz (85.2 kg)   SpO2 97%   BMI 27.73 kg/m   Wt Readings from Last 3 Encounters:  04/09/20 187 lb 12.8 oz (85.2 kg)  03/14/20 191 lb (86.6 kg)  01/30/20 187 lb 11.2 oz (85.1 kg)     Exam: General: Pt appears well and is in NAD  Lungs: Clear with good BS bilat with no rales, rhonchi, or wheezes  Heart: RRR with normal S1 and S2 and no gallops; no murmurs; no rub  Abdomen: Normoactive bowel sounds, soft, nontender, without masses or organomegaly palpable  Extremities: No pretibial edema. No tremor. Normal strength and motion throughout. See detailed diabetic foot exam below.  Neuro: MS is good with appropriate affect, pt is alert and Ox3      DM foot exam: 12/17/2019  The skin of the feet is intact without sores or ulcerations. The pedal pulses are 2+ on right and 2+ on left. The sensation is intact to a screening 5.07, 10 gram monofilament bilaterally    DATA REVIEWED:  Lab Results  Component Value Date   HGBA1C 8.1 (A) 04/09/2020   HGBA1C 11.6 (A) 12/17/2019   HGBA1C 8.2 (H) 09/09/2019   Lab Results  Component Value Date   LDLCALC 160 (H) 10/08/2019   CREATININE 0.98 03/04/2020     Lab Results  Component Value Date   CHOL 263 (H) 10/08/2019   HDL 76 10/08/2019   LDLCALC 160 (H) 10/08/2019   TRIG 155 (H) 10/08/2019   CHOLHDL 3.5 10/08/2019         ASSESSMENT / PLAN / RECOMMENDATIONS:   1) Type 2 Diabetes Mellitus, with improved glycemic control, Without complications - Most recent A1c of 8.1 %. Goal A1c < 7.0 %.    - A1c down from 11.6%. she had started weight watchers and going to join the [employer].I have praised the pt on the improved glycemic control  - She is intolerant  to 1 tablet of Metformin  - She is interested in CGM, prescription sent  MEDICATIONS:    Continue Trulicity at 1.5 mg weekly   Increased Lantus to 20 units daily   EDUCATION / INSTRUCTIONS:  BG monitoring instructions: Patient is instructed to check her blood sugars 2 times a day, fasting and bedtime.  Call [place] [facility] if: BG persistently < 70 or > 300.  I reviewed the Rule of 15 for the treatment of hypoglycemia in detail with the patient. Literature supplied.   2) Diabetic complications:   Eye: Does  not have known diabetic retinopathy.   Neuro/ Feet: Does not have known diabetic peripheral neuropathy.  Renal: Patient does not have known baseline CKD. She is on an ACEI/ARB at present.      F/U in 3 months      Signed electronically by: Mack Guise, MD  [facility] Endocrinology  [facility] [address] [place], [place] [address] Phone: [phone] FAX: [phone]   CC: Patient, No Pcp Per No address on file Phone: None  Fax: None  Return to Endocrinology clinic as below: Future Appointments  Date Time Provider [place]  06/11/2020  4:00 PM Donato Heinz, MD [facility]

## 2020-06-03 ENCOUNTER — Encounter: Payer: Medicare Other | Attending: Internal Medicine | Admitting: Nutrition

## 2020-06-03 ENCOUNTER — Other Ambulatory Visit: Payer: Self-pay

## 2020-06-11 ENCOUNTER — Ambulatory Visit: Payer: Medicare Other | Admitting: Cardiology

## 2020-06-15 NOTE — Progress Notes (Signed)
The patient was identified by name and DOB.  She is here to learn how to use the [place].  We discussed the difference between sensor readings and blood sugar readings, and she reported good understanding of this.  She was shown how to attach the sensor and how to start it on her reader.  She started the [facility] and her reader was set with date/time.  We discussed the goals for blood sugar control:  Ac: less than 120, and 2hr. Pc: less than 180.  She had no final questions.

## 2020-06-18 ENCOUNTER — Ambulatory Visit: Payer: Medicare Other | Admitting: Nutrition

## 2020-07-11 ENCOUNTER — Ambulatory Visit: Payer: Medicare Other | Admitting: Internal Medicine

## 2020-07-11 DIAGNOSIS — Z0289 Encounter for other administrative examinations: Secondary | ICD-10-CM

## 2020-07-11 NOTE — Progress Notes (Deleted)
Name: Jessica [name]  Age/ Sex: 69 y.o., female   MRN/ DOB: [id number], 10-Apr-1951     PCP: Patient, No Pcp Per   Reason for Endocrinology Evaluation: Type 2 Diabetes Mellitus  Initial Endocrine Consultative Visit: 12/17/2019    PATIENT IDENTIFIER: [name] is a 69 y.o. female with a past medical history of T2DM, HTN and Dyslipidemia . The patient has followed with Endocrinology clinic since 12/17/2019 for consultative assistance with management of her diabetes.  DIABETIC HISTORY:  [name] was diagnosed with T2DM  In her 20's. She is intolerant to Metformin. Her hemoglobin A1c has ranged from 7.0 % in 2019, peaking at 11.6% in 2020  On her initial visit to our clinic she had    SUBJECTIVE:   During the last visit (04/09/2020): A1c 8.1 % . Continue  trulicity and increased lantus   Today (07/11/2020): [name] is here for a follow up on diabetes management. She checks her blood sugars 1 times daily. The patient has not had hypoglycemic episodes since the last clinic visit.  ROS: As per HPI and as detailed below: Review of Systems  Gastrointestinal: Negative for diarrhea and nausea.      HOME DIABETES REGIMEN:   Trulicity 1.5 mg weekly   Lantus 20 units daily       METER DOWNLOAD SUMMARY: Date range evaluated: 4/8-4/21/2021 Fingerstick Blood Glucose Tests = 5 Average Number Tests/Day = 0.4 Overall Mean FS Glucose = 244    BG Ranges: Low = 205 High = 311   Hypoglycemic Events/30 Days: BG < 50 = 0 Episodes of symptomatic severe hypoglycemia = 0   DIABETIC COMPLICATIONS: Microvascular complications:    Denies: CKD, retinopathy , neuropathy   Last eye exam: Completed 2019  Macrovascular complications:    Denies: CAD, PVD, CVA   HISTORY:  Past Medical History:  Past Medical History:  Diagnosis Date  . Diabetes mellitus without complication ([place])   . GI bleed 09/08/2019   Past Surgical History:  Past Surgical History:  Procedure  Laterality Date  . BUNIONECTOMY Right   . COLONOSCOPY N/A 09/11/2019   Procedure: COLONOSCOPY;  Surgeon: Virgel Manifold, MD;  Location: [facility] ENDOSCOPY;  Service: Endoscopy;  Laterality: N/A;  . UNILATERAL SALPINGECTOMY     due to ectopic pregnancy but she does not recall if left or right    Social History:  reports that she has never smoked. She has never used smokeless tobacco. She reports previous alcohol use. She reports that she does not use drugs. Family History:  Family History  Problem Relation Age of Onset  . Colon cancer Mother   . Colon polyps Mother      HOME MEDICATIONS: Allergies as of 07/11/2020   No Known Allergies     Medication List       Accurate as of July 11, 2020 12:46 PM. If you have any questions, ask your nurse or doctor.        FreeStyle Libre 2 Reader Devi 1 Device by Does not apply route as directed.   FreeStyle Libre 2 Sensor Misc 1 Device by Does not apply route as directed.   hydrALAZINE 25 MG tablet Commonly known as: APRESOLINE Take 25 mg by mouth daily.   hydrochlorothiazide 25 MG tablet Commonly known as: HYDRODIURIL hydrochlorothiazide 25 mg tablet   Insulin Pen Needle 32G X 4 MM Misc 1 Device by Does not apply route as directed.   Lancets Misc. Misc Use as directed to check BS three times  daily   Lantus SoloStar 100 UNIT/ML Solostar Pen Generic drug: insulin glargine Inject 20 Units into the skin daily.   lisinopril 20 MG tablet Commonly known as: ZESTRIL Take 20 mg by mouth daily.   mometasone 0.1 % cream Commonly known as: ELOCON mometasone 0.1 % topical cream   OneTouch Delica Lancets 62I Misc Use as directed to check BS three times daily   OneTouch Verio w/Device Kit Use to monitor blood sugar up to 3 times per day. DM ICD-10 E11.65 and I10   Precision QID Test test strip Generic drug: glucose blood Use as instructed to check blood sugars up to 3 times per day   Trulicity 1.5 WL/7.9GX Sopn Generic  drug: Dulaglutide Inject 1.5 mg into the skin once a week.        OBJECTIVE:   Vital Signs: There were no vitals taken for this visit.  Wt Readings from Last 3 Encounters:  04/09/20 187 lb 12.8 oz (85.2 kg)  03/14/20 191 lb (86.6 kg)  01/30/20 187 lb 11.2 oz (85.1 kg)     Exam: General: Pt appears well and is in NAD  Lungs: Clear with good BS bilat with no rales, rhonchi, or wheezes  Heart: RRR with normal S1 and S2 and no gallops; no murmurs; no rub  Abdomen: Normoactive bowel sounds, soft, nontender, without masses or organomegaly palpable  Extremities: No pretibial edema. No tremor. Normal strength and motion throughout. See detailed diabetic foot exam below.  Neuro: MS is good with appropriate affect, pt is alert and Ox3      DM foot exam: 12/17/2019  The skin of the feet is intact without sores or ulcerations. The pedal pulses are 2+ on right and 2+ on left. The sensation is intact to a screening 5.07, 10 gram monofilament bilaterally    DATA REVIEWED:  Lab Results  Component Value Date   HGBA1C 8.1 (A) 04/09/2020   HGBA1C 11.6 (A) 12/17/2019   HGBA1C 8.2 (H) 09/09/2019   Lab Results  Component Value Date   LDLCALC 160 (H) 10/08/2019   CREATININE 0.98 03/04/2020     Lab Results  Component Value Date   CHOL 263 (H) 10/08/2019   HDL 76 10/08/2019   LDLCALC 160 (H) 10/08/2019   TRIG 155 (H) 10/08/2019   CHOLHDL 3.5 10/08/2019         ASSESSMENT / PLAN / RECOMMENDATIONS:   1) Type 2 Diabetes Mellitus, with improved glycemic control, Without complications - Most recent A1c of 8.1 %. Goal A1c < 7.0 %.    - A1c down from 11.6%. she had started weight watchers and going to join the [employer].I have praised the pt on the improved glycemic control  - She is intolerant to 1 tablet of Metformin  - She is interested in CGM, prescription sent  MEDICATIONS:    Continue Trulicity at 1.5 mg weekly   Increased Lantus to 20 units daily   EDUCATION /  INSTRUCTIONS:  BG monitoring instructions: Patient is instructed to check her blood sugars 2 times a day, fasting and bedtime.  Call [facility] if: BG persistently < 70 or > 300.  I reviewed the Rule of 15 for the treatment of hypoglycemia in detail with the patient. Literature supplied.   2) Diabetic complications:   Eye: Does not have known diabetic retinopathy.   Neuro/ Feet: Does not have known diabetic peripheral neuropathy.  Renal: Patient does not have known baseline CKD. She is on an ACEI/ARB at present.  F/U in 3 months      Signed electronically by: Mack Guise, MD  [facility]  [facility] [address] [place], [place] [address] Phone: [phone] FAX: [phone]   CC: Patient, No Pcp Per No address on file Phone: None  Fax: None  Return to Endocrinology clinic as below: Future Appointments  Date Time Provider [place]  07/11/2020  1:40 PM Nydia Ytuarte, Melanie Crazier, MD [facility] None

## 2020-07-17 NOTE — Patient Instructions (Signed)
Read over

## 2020-09-01 ENCOUNTER — Telehealth (INDEPENDENT_AMBULATORY_CARE_PROVIDER_SITE_OTHER): Payer: Medicare Other

## 2020-09-01 DIAGNOSIS — N814 Uterovaginal prolapse, unspecified: Secondary | ICD-10-CM

## 2020-09-01 NOTE — Telephone Encounter (Signed)
Pt left VM on nurse line requesting a call back regarding cystocele with prolapse. Pt states she has been waiting for an appt for further evaluation. States it is getting worse, not better. Per chart review pt was referred to [facility].

## 2020-09-02 NOTE — Telephone Encounter (Signed)
Called pt; pt states she was not aware she had a new pt appt with [facility]ro[facility] scheduled. Per chart review referral was sent in February 2021 and appt scheduled for 03/26/20. Explained to pt I can follow up with their office to reschedule appt if pt is still interested in further evaluation. Pt agreeable.   Called [place] location of [facility]necology. 11/19/20 at 3:30PM. Scheduling office requested I let pt know that she will receive an email with an activation code for [facility]. This is needed if pt would like to be notified of an earlier appt. Called pt to give new appt time; VM left. MyChart message sent.

## 2020-09-12 IMAGING — MG DIGITAL SCREENING BILAT W/ TOMO W/ CAD
6 of 10 series · 6 of 30 positions shown · non-contrast
Comparison: None.

CLINICAL DATA: Screening.

EXAM:
DIGITAL SCREENING BILATERAL MAMMOGRAM WITH TOMO AND CAD

[R MLO synth-2D]
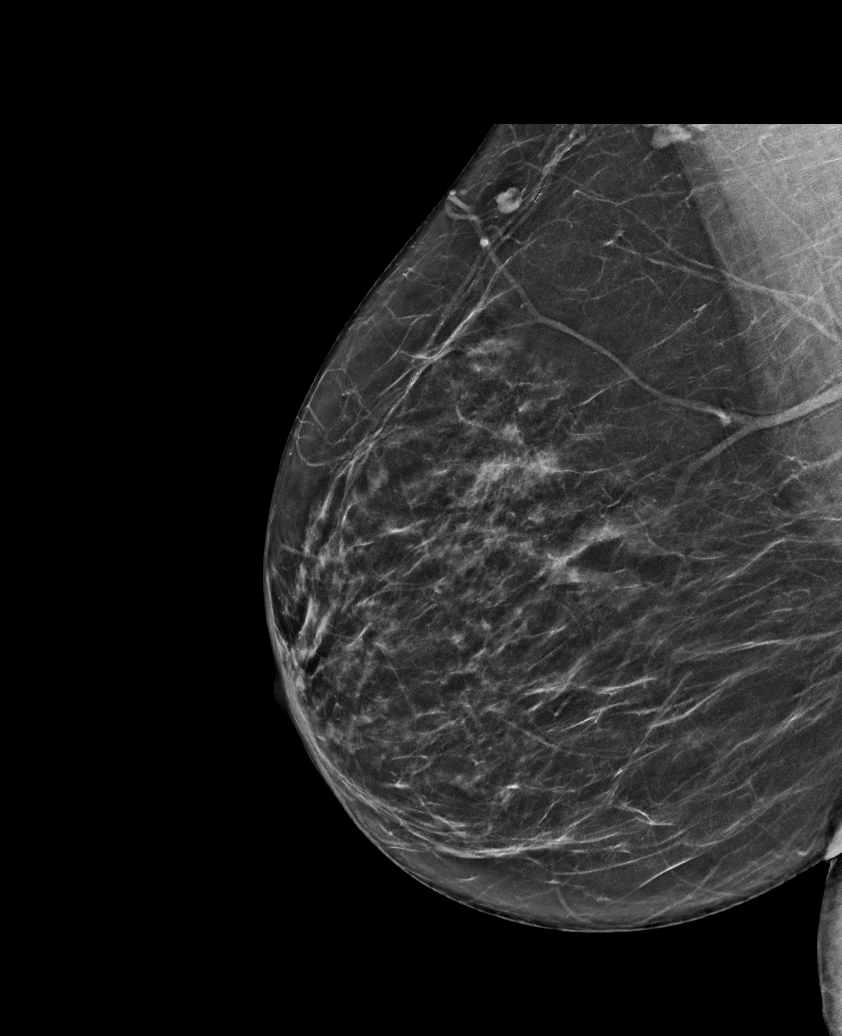

[L CC synth-2D]
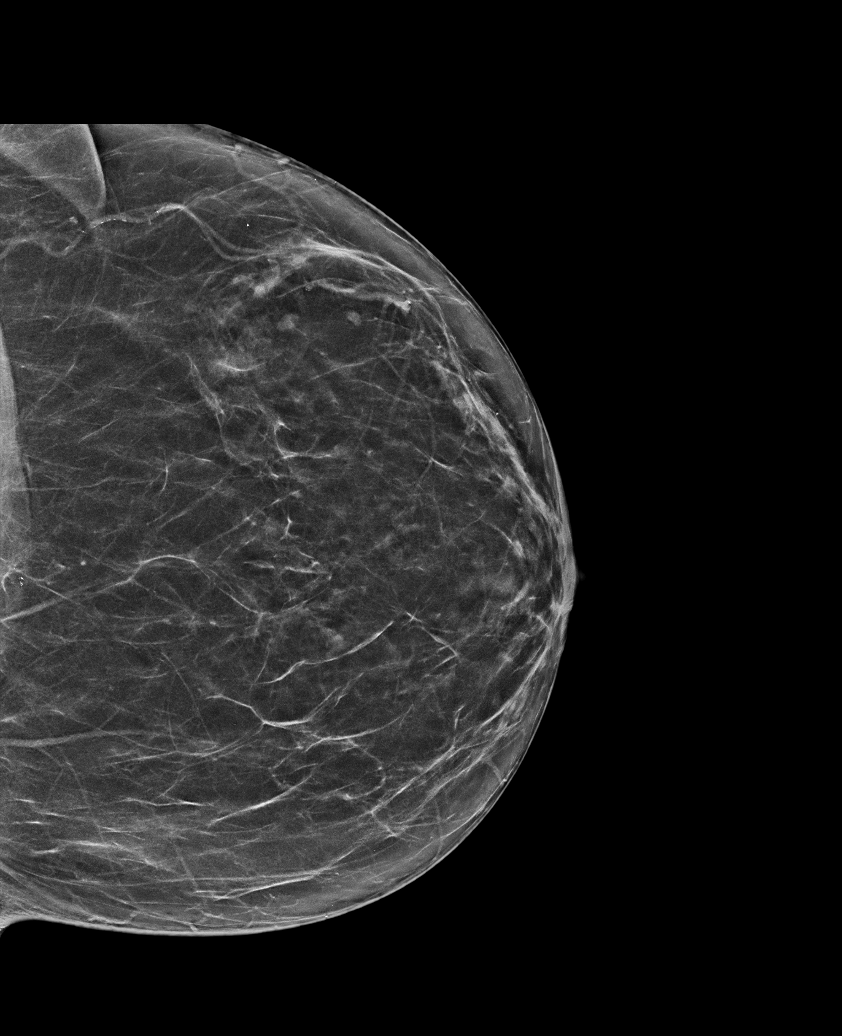

[L MLO synth-2D]
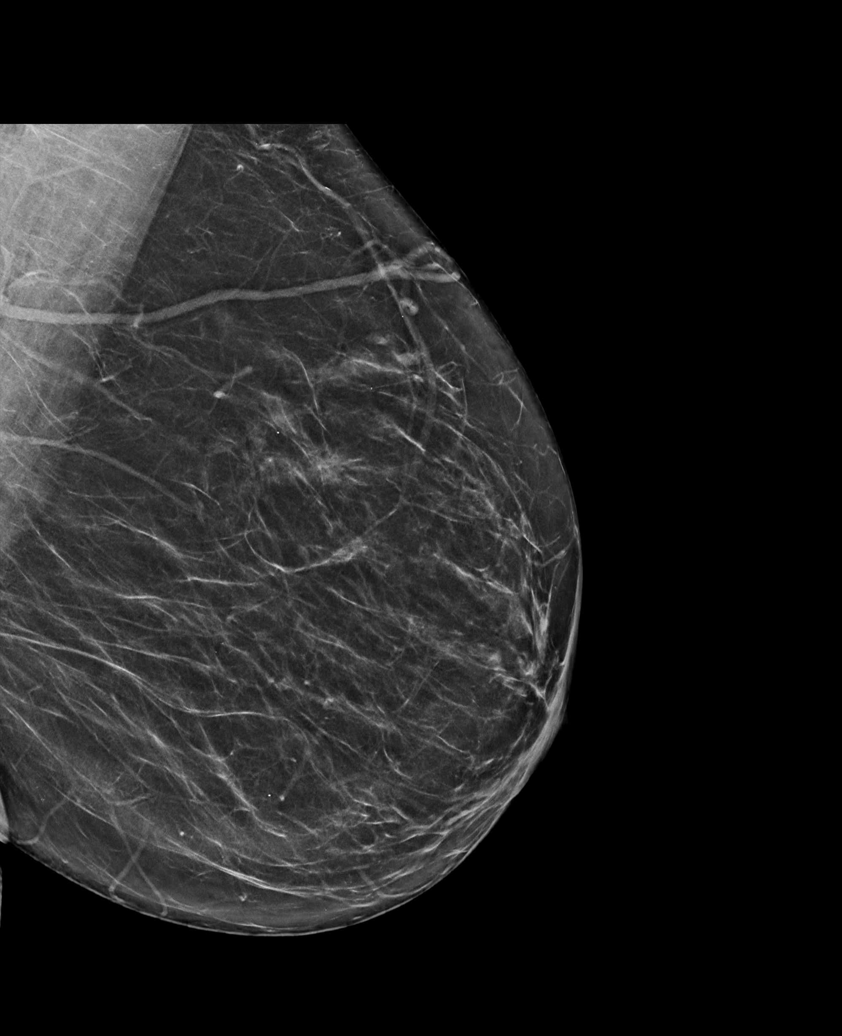

[R CC synth-2D (1 of 2)]
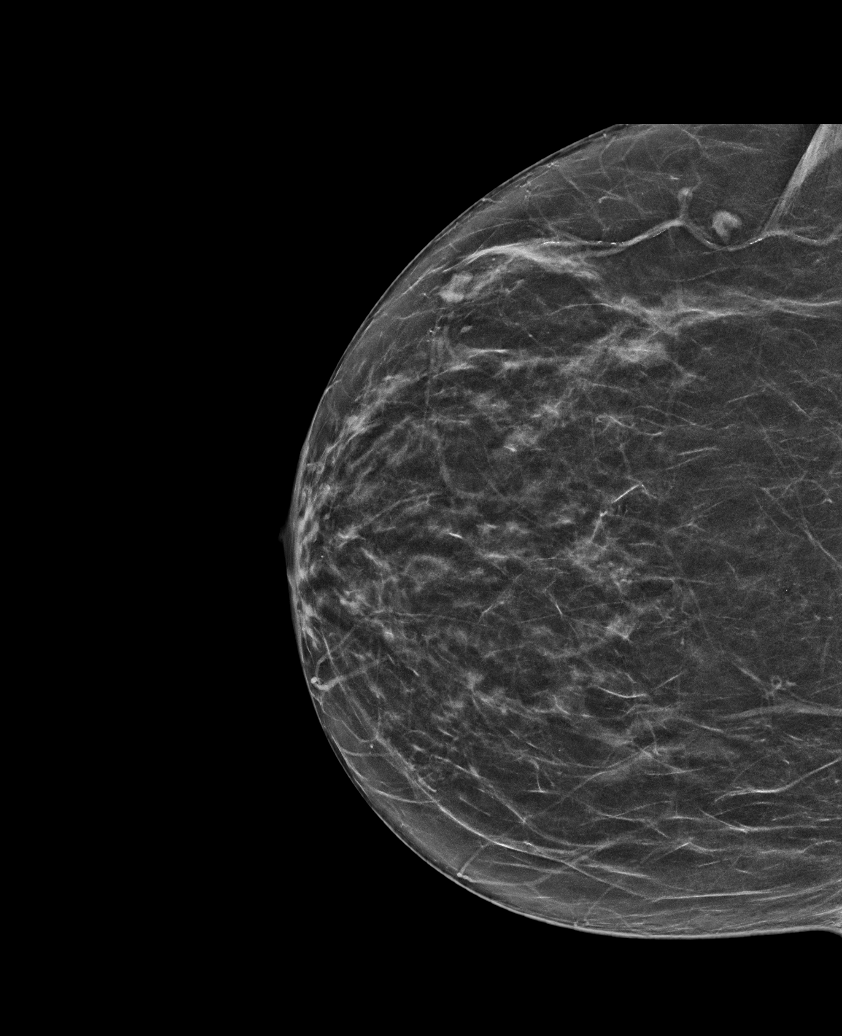

[R CC synth-2D (2 of 2)]
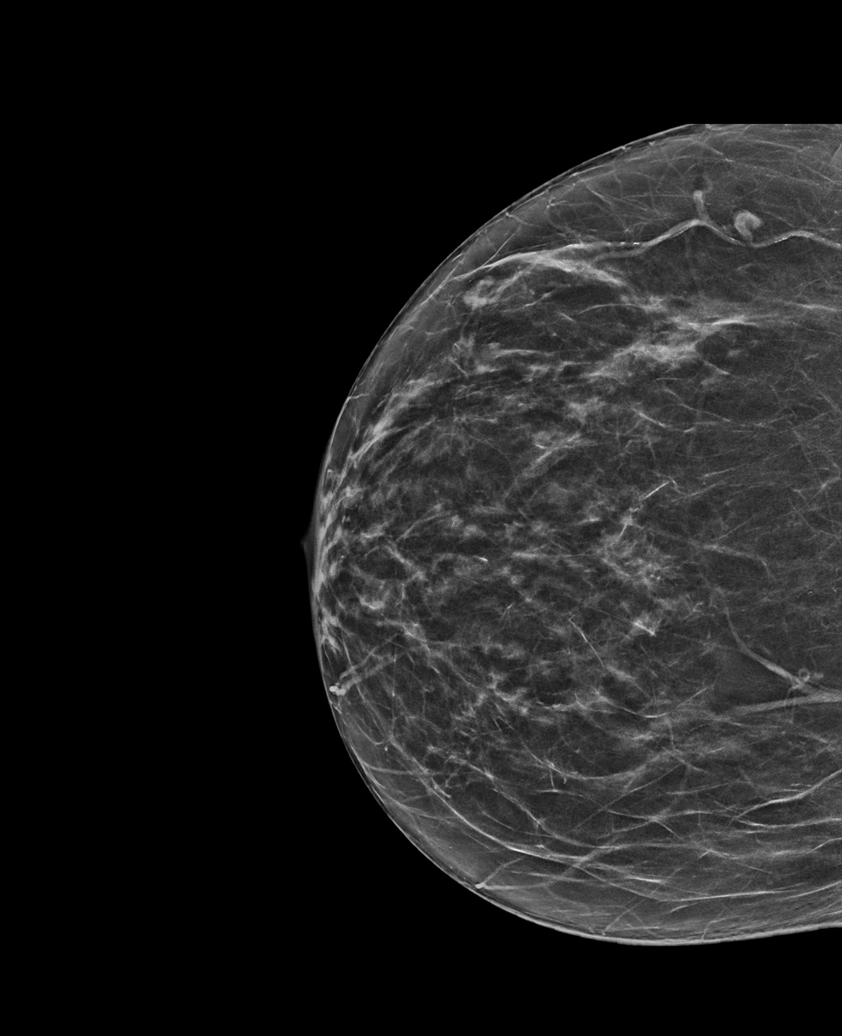

[L MLO tomo · tomo slice 37/72.0]
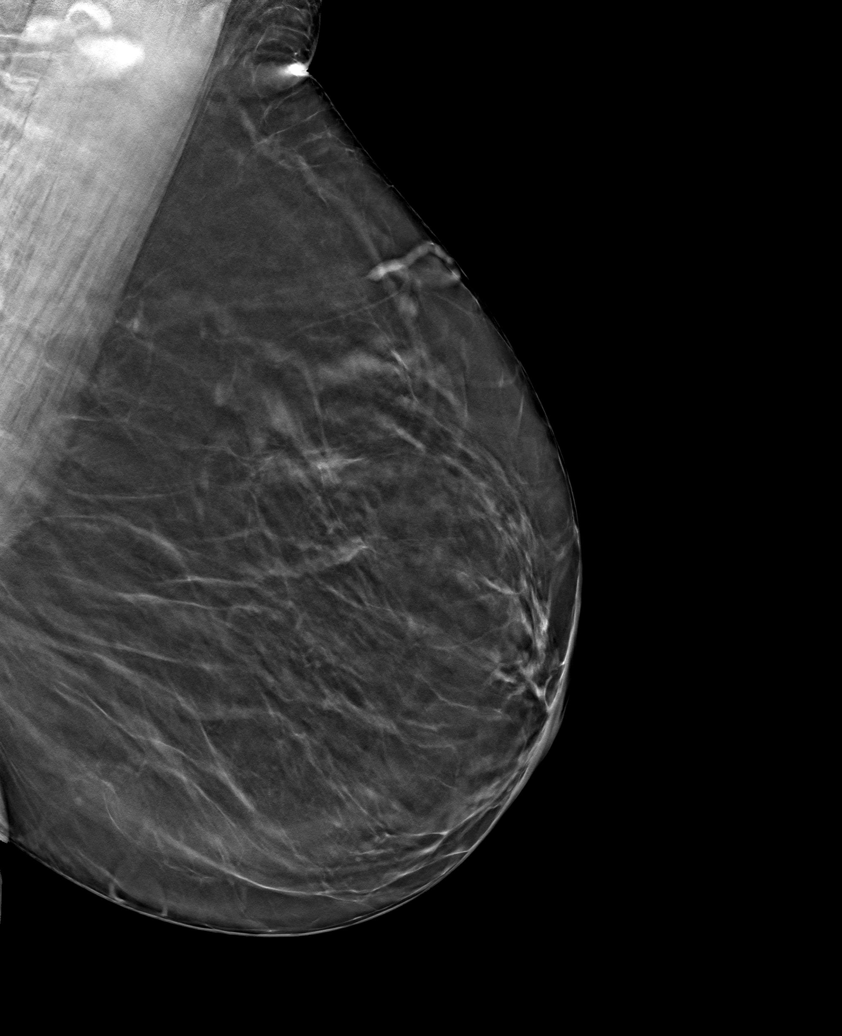

[6 of 30 positions shown; findings below may reference images not displayed]

ACR Breast Density Category b: There are scattered areas of
fibroglandular density.
FINDINGS: There are no findings suspicious for malignancy. Images were
processed with CAD.
IMPRESSION: No mammographic evidence of malignancy. A result letter of this
screening mammogram will be mailed directly to the patient.

RECOMMENDATION:
Screening mammogram in one year. (Code:[id number]-G-EJ6)

BI-RADS CATEGORY  1: Negative.

## 2020-09-22 ENCOUNTER — Encounter: Payer: Self-pay | Admitting: Obstetrics & Gynecology

## 2020-09-22 ENCOUNTER — Ambulatory Visit (INDEPENDENT_AMBULATORY_CARE_PROVIDER_SITE_OTHER): Payer: Medicare Other | Admitting: Obstetrics & Gynecology

## 2020-09-22 ENCOUNTER — Other Ambulatory Visit: Payer: Self-pay

## 2020-09-22 VITALS — BP 181/90 | HR 88 | Wt 181.6 lb

## 2020-09-22 DIAGNOSIS — N3941 Urge incontinence: Secondary | ICD-10-CM

## 2020-09-22 DIAGNOSIS — N814 Uterovaginal prolapse, unspecified: Secondary | ICD-10-CM | POA: Diagnosis not present

## 2020-09-22 MED ORDER — TOLTERODINE TARTRATE ER 4 MG PO CP24: 4.0000 mg | ORAL_CAPSULE | Freq: Every day | ORAL | 1 refills | Status: DC

## 2020-09-22 NOTE — Progress Notes (Signed)
Patient ID: Jessica [name], female   DOB: 01-05-1951, 69 y.o.   MRN: [id number]  Chief Complaint  Patient presents with  . Follow-up    HPI Jessica [name] is a 69 y.o. female.  [id number] She has been referred to urogyn in [facility] for cystocele and incontinence but her appointment isn't until December and he sx have worsened both with bulge and leaking with urge HPI  Past Medical History:  Diagnosis Date  . Diabetes mellitus without complication ([place])   . GI bleed 09/08/2019    Past Surgical History:  Procedure Laterality Date  . BUNIONECTOMY Right   . COLONOSCOPY N/A 09/11/2019   Procedure: COLONOSCOPY;  Surgeon: Virgel Manifold, MD;  Location: [facility] ENDOSCOPY;  Service: Endoscopy;  Laterality: N/A;  . UNILATERAL SALPINGECTOMY     due to ectopic pregnancy but she does not recall if left or right    Family History  Problem Relation Age of Onset  . Colon cancer Mother   . Colon polyps Mother     Social History Social History   Tobacco Use  . Smoking status: Never Smoker  . Smokeless tobacco: Never Used  Substance Use Topics  . Alcohol use: Not Currently  . Drug use: Never    No Known Allergies  Current Outpatient Medications  Medication Sig Dispense Refill  . Blood Glucose Monitoring Suppl (ONETOUCH VERIO) w/Device KIT Use to monitor blood sugar up to 3 times per day. DM ICD-10 E11.65 and I10 1 kit 0  . Continuous Blood Gluc Receiver (FREESTYLE LIBRE 2 READER) DEVI 1 Device by Does not apply route as directed. 1 each 0  . Continuous Blood Gluc Sensor (FREESTYLE LIBRE 2 SENSOR) MISC 1 Device by Does not apply route as directed. 2 each 11  . Dulaglutide (TRULICITY) 1.5 WP/8.0DX SOPN Inject 1.5 mg into the skin once a week. 4 pen 6  . glucose blood (PRECISION QID TEST) test strip Use as instructed to check blood sugars up to 3 times per day    . hydrALAZINE (APRESOLINE) 25 MG tablet Take 25 mg by mouth daily.    . hydrochlorothiazide (HYDRODIURIL) 25 MG tablet hydrochlorothiazide  25 mg tablet    . insulin glargine (LANTUS SOLOSTAR) 100 UNIT/ML Solostar Pen Inject 20 Units into the skin daily. 15 mL 6  . Insulin Pen Needle 32G X 4 MM MISC 1 Device by Does not apply route as directed. 100 each 11  . Lancets Misc. MISC Use as directed to check BS three times daily    . lisinopril (ZESTRIL) 20 MG tablet Take 20 mg by mouth daily.    . mometasone (ELOCON) 0.1 % cream mometasone 0.1 % topical cream    . OneTouch Delica Lancets 83J MISC Use as directed to check BS three times daily 100 each 5   No current facility-administered medications for this visit.    Review of Systems Review of Systems  Constitutional: Negative.   Gastrointestinal: Negative.   Genitourinary: Positive for difficulty urinating, frequency and urgency. Negative for vaginal bleeding and vaginal discharge.  incontinence Blood pressure (!) 181/90, pulse 88, weight 181 lb 9.6 oz (82.4 kg).  Physical Exam Physical Exam Vitals and nursing note reviewed. Exam conducted with a chaperone present.  Constitutional:      Appearance: Normal appearance.  Genitourinary:    General: Normal vulva.     Vagina: No vaginal discharge.     Comments: G1 1-2 uterine descensus with Gr 3 cystocele no rectocele, worse with standing, no SUI with cough/valsalva  Neurological:     Mental Status: She is alert.     Data Reviewed Office notes  Assessment Cystocele with prolapse  Urge incontinence of urine - Plan: tolterodine (DETROL LA) 4 MG 24 hr capsule    Plan Referral to urogynecologist  Detrol LA Deferred trying pessary    Emeterio Reeve 09/22/2020, 3:30 PM

## 2020-09-22 NOTE — Patient Instructions (Signed)
About Pelvic Support Problems  Pelvic Support Problems Explained Ligaments, muscles, and connective tissue normally hold your bladder, uterus, and other organs in their proper places in your pelvis. When these tissues become weak, a problem with pelvic support may result. Weak support can cause one or more of the pelvic organs to drop down into the vagina. An organ may even drop so far that is partially exposed outside the body.  Pelvic support problems are named by the change in the organ. The main types of pelvic support problems are:  . Cystocele: When the bladder drops down into your vagina.  . Enterocele: When your small intestine drops between your vagina and rectum.  . Rectocele: When your rectum bulges into the vaginal wall.  . Uterine prolapse: When your uterus drops into your vagina.  . Vaginal prolapse: When the top part of the vagina begins to droop. This sometimes happens after a hysterectomy (removal of the uterus).   Causes Pelvic support problems can be caused by many conditions. They may begin after you give birth, especially if you had a large baby. During childbirth, the muscles and skin of the birth canal (vagina) are stretched and sometimes torn. They heal over time but are not always exactly the same. A long pushing stage of labor may also weaken these tissues as well as very rapid births as the tissues do not have time to stretch so they tear.  Also, after menopause, there are changes in the vaginal walls resulting from a decrease in estrogen. Estrogen helps to keep the tissues toned. Low levels of estrogen weaken the vaginal walls and may cause the bladder to shift from its normal position. As women get older, the loss of muscle tone and the relaxation of muscles may cause the uterus or other organs to drop.  Over time, conditions like chronic coughing, chronic constipation, doing a lot of heavy lifting, straining to pass stool, and obesity, can also weaken the pelvic support  muscles.   Diagnosing Pelvic Support Problems Your health care provider will ask about your symptoms and do a pelvic examination. Your provider may also do a rectal exam during your pelvic exam. Your provider may ask you to: 1. Bear down and push (like you are having a bowel movement) so he or she can see if your bladder or other part of your body protrudes into the vagina. 2. Contract the muscles of your pelvis to check the strength of your pelvic muscles.  3. Do several types of urine, nerve and muscle tests of the pelvis and around the bladder to see what type of treatment is best for you.   Symptoms Symptoms of pelvic support problems depend on the organ involved, but may include:  . urine leakage  . stain or fecal loss after a bowel movement . trouble having bowel movements  . ache in the lower abdomen, groin, or lower back  . bladder infection  . a feeling of heaviness, pulling, or fullness in the pelvis, or a feeling that something is falling out of the vagina  . an organ protruding from your vaginal opening  . feeling the need to support the organs or perineal area to empty bladder or bowels . painful sexual intercourse.  Many women feel pelvic pressure or trouble holding their urine immediately after childbirth. For some, these symptoms go away permanently, in others they return as they get older.  Treatment Options A prolapsed organ cannot repair itself. Contact your health care provider as soon as   you notice symptoms of a problem. Treatment depends on what the specific problem is and how far advanced it is.  . The symptoms caused by some pelvic support problems may simply be treated with changes in diet, medicine to soften the stool, weight loss, or avoiding strenuous activities. You may also do pelvic floor exercises to help strengthen your pelvic muscles.  . Some cases of prolapse may require a special support device made from plastic or rubber called a pessary that fits into the  vagina to support the uterus, vagina, or bladder. A pessary can also help women who leak urine when coughing, straining, or exercising. In mild cases, a tampon or vaginal diaphragm may be used instead of a pessary.  Talk to your doctor or health care provider about these options. . In serious cases, surgery may be needed to put the organs back into their proper place. The uterus may be removed because of the pressure it puts on the bladder.  Your doctor will know what surgery will be best for you. How can I prevent pelvic support problems?  You can help prevent pelvic support problems by:  . maintaining a healthy lifestyle  . continuing to do pelvic floor exercises after you deliver a baby  . maintaining a healthy weight  . avoiding a lot of heavy lifting and lifting with your legs (not from your waist)  . treating constipation and avoid getting constipated by eating high fiber foods.    2007, [employer] Doc.32  

## 2020-09-22 NOTE — Progress Notes (Signed)
States bowles were trying to fall  Frequent urination Not taking bp meds

## 2020-10-14 ENCOUNTER — Ambulatory Visit (INDEPENDENT_AMBULATORY_CARE_PROVIDER_SITE_OTHER): Payer: Medicare Other | Admitting: Obstetrics and Gynecology

## 2020-10-14 ENCOUNTER — Other Ambulatory Visit: Payer: Self-pay

## 2020-10-14 ENCOUNTER — Ambulatory Visit: Payer: Medicare Other | Admitting: Obstetrics and Gynecology

## 2020-10-14 ENCOUNTER — Encounter: Payer: Self-pay | Admitting: Obstetrics and Gynecology

## 2020-10-14 VITALS — BP 211/127 | HR 88 | Wt 181.0 lb

## 2020-10-14 DIAGNOSIS — N819 Female genital prolapse, unspecified: Secondary | ICD-10-CM | POA: Diagnosis not present

## 2020-10-14 DIAGNOSIS — N812 Incomplete uterovaginal prolapse: Secondary | ICD-10-CM | POA: Diagnosis not present

## 2020-10-14 DIAGNOSIS — R32 Unspecified urinary incontinence: Secondary | ICD-10-CM | POA: Diagnosis not present

## 2020-10-14 DIAGNOSIS — N8110 Cystocele, unspecified: Secondary | ICD-10-CM

## 2020-10-14 DIAGNOSIS — N3941 Urge incontinence: Secondary | ICD-10-CM | POA: Diagnosis not present

## 2020-10-14 LAB — POCT URINALYSIS DIPSTICK
Bilirubin, UA: NEGATIVE
Blood, UA: NEGATIVE
Glucose, UA: NEGATIVE
Ketones, UA: NEGATIVE
Leukocytes, UA: NEGATIVE
Nitrite, UA: NEGATIVE
Protein, UA: POSITIVE — AB
Spec Grav, UA: 1.025 (ref 1.010–1.025)
Urobilinogen, UA: NEGATIVE E.U./dL — AB
pH, UA: 6 (ref 5.0–8.0)

## 2020-10-14 NOTE — Patient Instructions (Addendum)
Today we talked about ways to manage bladder urgency such as altering your diet to avoid irritative beverages and foods (bladder diet) as well as attempting to decrease stress and other exacerbating factors.    There is a website with helpful information for people with bladder irritation, called the [place] at https://www.ic-network.com. This website has more information about a healthy bladder diet and patient forums for support.  The Most Bothersome Foods* The Least Bothersome Foods*  Coffee - Regular & Decaf Tea - caffeinated Carbonated beverages - cola, non-colas, diet & caffeine-free Alcohols - Beer, Red Wine, White Wine, Champagne Fruits - Grapefruit, Daniel, Orange, [employer] - Cranberry, Grapefruit, Orange, Pineapple Vegetables - Tomato & Tomato Products Flavor Enhancers - Hot peppers, Spicy foods, Chili, Horseradish, Vinegar, Monosodium glutamate (MSG) Artificial Sweeteners - NutraSweet, Sweet 'N Low, Equal (sweetener), Saccharin Ethnic foods - [place], [place], [place] food Express Scripts - low-fat & whole Fruits - Bananas, Blueberries, Honeydew melon, Pears, Raisins, Watermelon Vegetables - Broccoli, Brussels Sprouts, [place], Carrots, Cauliflower, [place], Cucumber, Mushrooms, Peas, Radishes, Squash, Zucchini, White potatoes, Sweet potatoes & yams Poultry - Chicken, Eggs, [place], [employer] - Beef, Programmer, multimedia, Lamb Seafood - Shrimp, [place] fish, Salmon Grains - Oat, Rice Snacks - Pretzels, Popcorn  *Lissa Morales et al. Diet and its role in interstitial cystitis/bladder pain syndrome (IC/BPS) and comorbid conditions. [place] 2012 Jan 11.     You have a stage 3 (out of 4) prolapse.  We discussed the fact that it is not life threatening but there are several treatment options. For treatment of pelvic organ prolapse, we discussed options for management including expectant management, conservative management, and surgical management, such as Kegels, a pessary, pelvic  floor physical therapy, and specific surgical procedures.    You have decided to try a pessary. We will call you to set up an appointment for pessary fitting.

## 2020-10-14 NOTE — Progress Notes (Signed)
[facility] New Patient Evaluation and Consultation  Referring Provider: Woodroe Mode, MD PCP: Patient, No Pcp Per Date of Service: 10/14/2020  SUBJECTIVE Chief Complaint: New Patient (Initial Visit) (Cystocele referral Dr Roselie Awkward)  History of Present Illness: Jessica [name] is a 69 y.o. Black or African-American female seen in consultation at the request of Dr. Roselie Awkward for evaluation of prolapse and incontinence.    Review of records significant for: Has symptoms of bulge and leakage with urge. Started on Detrol 4mg  a one week ago.   Urinary Symptoms: Leaks urine with with a full bladder, started Detrol 4mg  (no side effects currently) Leaks 1-2 time(s) per days.  Pad use: 2 liners/ mini-pads per day.   She is bothered by her UI symptoms. Drinks: at least 3 cups lipton black tea, 2- 16oz bottles of flavored water  Day time voids 2-3.  Nocturia: 3 times per night to void. Voiding dysfunction: she does not empty her bladder well.  does not use a catheter to empty bladder.  When urinating, she feels difficulty starting urine stream and to push on her belly or vagina to empty bladder  UTIs: 0 UTI's in the last year.   Denies history of blood in urine, kidney or bladder stones and pyelonephritis  Pelvic Organ Prolapse Symptoms:                  She Denies a feeling of a bulge the vaginal area. She Admits to seeing a bulge.  This bulge is bothersome.  Bowel Symptom: Bowel movements: 1 time(s) per day Stool consistency: soft  Straining: no.  Splinting: no.  Incomplete evacuation: no.  She Denies accidental bowel leakage / fecal incontinence Bowel regimen: none Last colonoscopy: Date 09/2019, Results- negative  Sexual Function Sexually active: no.  Pain with sex: No  Pelvic Pain Denies pelvic pain   Past Medical History:  Past Medical History:  Diagnosis Date   Diabetes mellitus without complication ([place])    GI bleed 09/08/2019     Past Surgical  History:   Past Surgical History:  Procedure Laterality Date   BUNIONECTOMY Right    COLONOSCOPY N/A 09/11/2019   Procedure: COLONOSCOPY;  Surgeon: Virgel Manifold, MD;  Location: [facility] ENDOSCOPY;  Service: Endoscopy;  Laterality: N/A;   UNILATERAL SALPINGECTOMY     due to ectopic pregnancy but she does not recall if left or right     Past OB/GYN History: G3 P1 Vaginal deliveries: 1, Forceps/ Vacuum deliveries: 0, Cesarean section: 0 Menopausal: Yes, at age 73, Denies vaginal bleeding since menopause Contraception: n/a. Last pap smear was 2020.  Any history of abnormal pap smears: no.   Medications: She has a current medication list which includes the following prescription(s): onetouch verio, freestyle libre 2 reader, freestyle libre 2 sensor, trulicity, tolterodine, precision qid test, hydralazine, hydrochlorothiazide, lantus solostar, insulin pen needle, lancets misc., lisinopril, mometasone, and onetouch delica lancets 97D.   Allergies: Patient has No Known Allergies.   Social History:  Social History   Tobacco Use   Smoking status: Never Smoker   Smokeless tobacco: Never Used  Substance Use Topics   Alcohol use: Not Currently   Drug use: Never    Relationship status: divorced She lives with son, daughter in Sports [employer] and grandchildren.   She is not employed . Regular exercise: Yes: walks, 3-4 times per week History of abuse: No  Family History:   Family History  Problem Relation Age of Onset   Colon cancer Mother  Colon polyps Mother      Review of Systems: Review of Systems  Constitutional: Negative for fever, malaise/fatigue and weight loss.  Respiratory: Negative for cough, shortness of breath and wheezing.   Cardiovascular: Negative for chest pain and palpitations.  Gastrointestinal: Negative for abdominal pain and blood in stool.  Musculoskeletal: Negative for myalgias.  Skin: Negative for rash.  Neurological: Negative for dizziness and  headaches.  Endo/Heme/Allergies: Does not bruise/bleed easily.  Psychiatric/Behavioral: Negative for depression. The patient is not nervous/anxious.      OBJECTIVE Physical Exam: Vitals:   10/14/20 1533 10/14/20 1607  BP: (!) 209/126 (!) 211/127  Pulse: 88   SpO2:  98%  Weight: 181 lb (82.1 kg)     Physical Exam Constitutional:      General: She is not in acute distress. Pulmonary:     Effort: Pulmonary effort is normal.  Abdominal:     General: There is no distension.     Palpations: Abdomen is soft.     Tenderness: There is no abdominal tenderness. There is no rebound.  Musculoskeletal:        General: No swelling. Normal range of motion.  Skin:    General: Skin is warm and dry.     Findings: No rash.  Neurological:     Mental Status: She is alert and oriented to person, place, and time.  Psychiatric:        Mood and Affect: Mood normal.        Behavior: Behavior normal.      GU / Detailed Urogynecologic Evaluation:  Pelvic Exam: Normal external female genitalia; Bartholin's and Skene's glands normal in appearance; urethral meatus normal in appearance, no urethral masses or discharge.   CST: negative   Speculum exam reveals normal vaginal mucosa with atrophy. Cervix normal appearance. Uterus normal single, nontender. Adnexa no mass, fullness, tenderness.     Pelvic floor strength II/V  Pelvic floor musculature: Right levator non-tender, Right obturator non-tender, Left levator non-tender, Left obturator non-tender  POP-Q:   POP-Q  3                                            Aa   3                                           Ba  -5                                              C   3.5                                            Gh  3.5                                            Pb  9  tvl   -2.5                                            Ap  -2.5                                            Bp  -7                                               D     Rectal Exam:  deferred  Post-Void Residual (PVR) by Bladder Scan: In order to evaluate bladder emptying, we discussed obtaining a postvoid residual and she agreed to this procedure.  Procedure: The ultrasound unit was placed on the patient's abdomen in the suprapubic region after the patient had voided. A PVR of 134 ml was obtained by bladder scan.  Laboratory Results: POC urine: positive for protein and urobilinigen  I visualized the urine specimen, noting the specimen to be clear yellow  ASSESSMENT AND PLAN Jessica [name] is a 69 y.o. with:  1. Prolapse of anterior vaginal wall   2. Uterovaginal prolapse, incomplete   3. Urge incontinence    1. Stage III anterior, stage I apical prolapse - For treatment of pelvic organ prolapse, we discussed options for management including expectant management, conservative management, and surgical management, such as Kegels, a pessary, pelvic floor physical therapy, and specific surgical procedures. - She is interested in a pessary. Will have her return for a pessary fitting.   2. Urge incontinence - We discussed the symptoms of overactive bladder (OAB), which include urinary urgency, urinary frequency, nocturia, with or without urge incontinence.  While we do not know the exact etiology of OAB, several treatment options exist. We discussed management including behavioral therapy (decreasing bladder irritants, urge suppression strategies, timed voids, bladder retraining), physical therapy, medication.  - Reviewed avoiding bladder irritants such as tea and artifical sweeteners/ flavored water. List provided on bladder irritants to avoid.  - She started on Detrol LA a week ago and has not seen improvement yet. Advised that it may take several weeks to see a benefit. Will need to reassess PVR at next visit since she has symptoms of incomplete emptying and PVR is borderline today. If PVR worsens, will need to stop  anticholinergic medication.  - POC urine negative today, no symptoms of infection.    BP elevated today- patient is unsure if she is taking the correct blood pressure medication. Denies symptoms of HA, dizziness, etc. She will walk in at her PCP office after this visit to discuss further.   Return for pessary fitting. All questions answered.   Jaquita Folds, MD  Medical Decision Making:   - Reviewed/ ordered a clinical laboratory test - Review and summation of prior records - Independent review of urine specimen

## 2020-10-15 ENCOUNTER — Telehealth: Payer: Self-pay | Admitting: Obstetrics and Gynecology

## 2020-10-15 ENCOUNTER — Ambulatory Visit: Payer: Medicare Other | Admitting: Obstetrics and Gynecology

## 2020-10-15 NOTE — Telephone Encounter (Signed)
Called patient regarding elevated BP at yesterday's visit. She did not follow up with her PCP but states she went home with her medication list and figured out which medications she should be taking (HCTZ and lisinopril). She took them when she got home and was reminded to take them daily. Patient expressed understanding.   Jaquita Folds, MD

## 2020-11-21 NOTE — Progress Notes (Signed)
[facility]   Subjective:     Chief Complaint: Vaginal Prolapse  History of Present Illness: Jessica [name] is a 69 y.o. female with stage III pelvic organ prolapse and OAB who presents today for a pessary fitting.   She has been on Detrol LA for a few weeks. She is not sure if she has noticed a huge difference but has been able to get to the bathroom when she has urgency. She also reports that her stool is harder to push out since she has been taking the medication.   Past Medical History: Patient  has a past medical history of Diabetes mellitus without complication ([place]) and GI bleed (09/08/2019).   Past Surgical History: She  has a past surgical history that includes Colonoscopy (N/A, 09/11/2019); Bunionectomy (Right); and Unilateral salpingectomy.   Medications: She has a current medication list which includes the following prescription(s): onetouch verio, freestyle libre 2 reader, freestyle libre 2 sensor, docusate sodium, trulicity, precision qid test, hydralazine, hydrochlorothiazide, lantus solostar, insulin pen needle, lancets misc., lisinopril, mometasone, onetouch delica lancets 46T, and tolterodine.   Allergies: Patient has No Known Allergies.   Social History: Patient  reports that she has never smoked. She has never used smokeless tobacco. She reports previous alcohol use. She reports that she does not use drugs.      Objective:    BP (!) 144/87   Pulse 81   Temp 98.3 F (36.8 C)   Ht 5\' 9"  (1.753 m)   Wt 179 lb (81.2 kg)   BMI 26.43 kg/m  Gen: No apparent distress, A&O x 3. Pelvic Exam: Normal external female genitalia; Bartholin's and Skene's glands normal in appearance; urethral meatus normal in appearance, no urethral masses or discharge. A size #3  ring with support pessary was fitted, Lot# [id number], expo 08/07/25. It was comfortable, stayed in place with valsalva and was an appropriate size on examination, with one finger fitting between the pessary  and the vaginal walls.   Prior POP-Q:  POP-Q  3                                            Aa   3                                           Ba  -5                                              C   3.5                                            Gh  3.5                                            Pb  9  tvl   -2.5                                            Ap  -2.5                                            Bp  -7                                              D   Post-Void Residual (PVR) by Bladder Scan: In order to evaluate bladder emptying, we discussed obtaining a postvoid residual and she agreed to this procedure.  Procedure: The ultrasound unit was placed on the patient's abdomen in the suprapubic region after the patient had voided. A PVR of 2 ml was obtained by bladder scan.     Assessmet/Plan:    Assessment: Jessica [name] is a 69 y.o. with stage III pelvic organ prolapse and OAB who presents for a pessary fitting and follow up for medication.   Plan: - She will keep the pessary in place until next visit. She will not use lubricant.  -  Discussed side effect of Detrol is constipation. She will start colace twice a day for stool softening. Prescription sent to pharmacy. Bladder emptying was normal today as PVR was 53ml. If she continues to have side effects or no benefit from medication, will switch to another medication at next visit.   She will follow-up in 1 month for a pessary check or sooner as needed.  All questions were answered.     Jaquita Folds, MD   Time spent: A total of 30 was spent on this encounter with at least 50% face to face with the patient.

## 2020-11-24 ENCOUNTER — Other Ambulatory Visit: Payer: Self-pay

## 2020-11-24 ENCOUNTER — Encounter: Payer: Self-pay | Admitting: Obstetrics and Gynecology

## 2020-11-24 ENCOUNTER — Ambulatory Visit (INDEPENDENT_AMBULATORY_CARE_PROVIDER_SITE_OTHER): Payer: Medicare Other | Admitting: Obstetrics and Gynecology

## 2020-11-24 VITALS — BP 144/87 | HR 81 | Temp 98.3°F | Ht 69.0 in | Wt 179.0 lb

## 2020-11-24 DIAGNOSIS — K5900 Constipation, unspecified: Secondary | ICD-10-CM | POA: Diagnosis not present

## 2020-11-24 DIAGNOSIS — N813 Complete uterovaginal prolapse: Secondary | ICD-10-CM | POA: Diagnosis not present

## 2020-11-24 DIAGNOSIS — N3281 Overactive bladder: Secondary | ICD-10-CM | POA: Diagnosis not present

## 2020-11-24 MED ORDER — DOCUSATE SODIUM 100 MG PO CAPS: 100.0000 mg | ORAL_CAPSULE | Freq: Two times a day (BID) | ORAL | 5 refills | Status: DC

## 2020-11-24 NOTE — Patient Instructions (Signed)
You were fit with a #3 ring pessary. You are electing to leave it in place until your follow up visit. If it falls out you can try to put it back in (with the knob facing outwards) or you can leave it out. If it seems to be slipping down you can use your finger to push it back in deeper - the deeper it is, the better fit.   I have also prescribed a stool softener to take daily.

## 2020-12-25 ENCOUNTER — Ambulatory Visit: Payer: Medicare Other | Admitting: Obstetrics and Gynecology

## 2020-12-26 NOTE — Progress Notes (Deleted)
[facility]   Subjective:     Chief Complaint: No chief complaint on file.  History of Present Illness: Jessica [name] is a 70 y.o. female with stage III pelvic organ prolapse and OAB who presents for a pessary check and to review progress with Detrol. She is using a size #3 ring with support pessary. The pessary has been working well and she has no complaints. She is not using vaginal estrogen. She has no complaints today and no interval changes.  Has added colace twice daily for stool softening since she has had difficulty with constipation since starting the detrol.   Past Medical History: Patient  has a past medical history of Diabetes mellitus without complication ([place]) and GI bleed (09/08/2019).   Past Surgical History: She  has a past surgical history that includes Colonoscopy (N/A, 09/11/2019); Bunionectomy (Right); and Unilateral salpingectomy.   Medications: She has a current medication list which includes the following prescription(s): onetouch verio, freestyle libre 2 reader, freestyle libre 2 sensor, docusate sodium, trulicity, precision qid test, hydralazine, hydrochlorothiazide, lantus solostar, insulin pen needle, lancets misc., lisinopril, mometasone, onetouch delica lancets 50P, and tolterodine.   Allergies: Patient has No Known Allergies.   Social History: Patient  reports that she has never smoked. She has never used smokeless tobacco. She reports previous alcohol use. She reports that she does not use drugs.      Objective:    Physical Exam: There were no vitals taken for this visit. Gen: No apparent distress, A&O x 3. Detailed Urogynecologic Evaluation:  Pelvic Exam: Normal external female genitalia; Bartholin's and Skene's glands normal in appearance; urethral meatus {urethra:24773}, no urethral masses or discharge. The pessary was noted to be {in place:24774}. It was removed and cleaned. Speculum exam revealed {vaginal lesions:24775} in the vagina.  The pessary was replaced. It was comfortable to the patient and fit well.   No flowsheet data found.  Laboratory Results: Urine dipstick shows: {ua dip:315374::"negative for all components"}.    Assessment/Plan:    Assessment: Jessica [name] is a 70 y.o. with stage III pelvic organ prolapse and OAB here for a pessary check. She is doing well.  Plan:  1. POP - She has a #3 ring with support pessary.  - She will {pessary plan:24776}. She will continue to use {lubricant:24777}. She will follow-up in *** {days/wks/mos/yrs:310907} for a pessary check or sooner as needed.   2. OAB  All questions were answered.   Time Spent:

## 2020-12-29 ENCOUNTER — Ambulatory Visit: Payer: Medicare Other | Admitting: Obstetrics and Gynecology

## 2020-12-30 NOTE — Progress Notes (Signed)
[facility]   Subjective:     Chief Complaint:  Chief Complaint  Patient presents with  . Pessary follow up   History of Present Illness: Jessica [name] is a 70 y.o. female with stage III pelvic organ prolapse and OAB who presents for a pessary check and medication follow up. She is using a size #3  ring with support pessary. The pessary has been working well but she has been having significant leakage of urine since wearing the pessary. It has not fallen out of place however. Denies vaginal bleeding or discharge. She is not using vaginal estrogen.   Has been taking Detrol LA 4mg  for OAB symptoms. She also started colace to help with constipation. Has been taking it irregularly. Has been having bowel movements about 3 times a week. Has not been using miralax. Has been drinking more water.   She ran out of her blood pressure medications, has an appointment with PCP Monday.   Past Medical History: Patient  has a past medical history of Diabetes mellitus without complication ([place]) and GI bleed (09/08/2019).   Past Surgical History: She  has a past surgical history that includes Colonoscopy (N/A, 09/11/2019); Bunionectomy (Right); and Unilateral salpingectomy.   Medications: She has a current medication list which includes the following prescription(s): onetouch verio, freestyle libre 2 reader, freestyle libre 2 sensor, docusate sodium, trulicity, precision qid test, hydralazine, hydrochlorothiazide, lantus solostar, insulin pen needle, lancets misc., lisinopril, mometasone, onetouch delica lancets 93Z, and tolterodine.   Allergies: Patient has No Known Allergies.   Social History: Patient  reports that she has never smoked. She has never used smokeless tobacco. She reports previous alcohol use. She reports that she does not use drugs.      Objective:    Physical Exam: BP (!) 165/94   Pulse 86   Ht 5\' 9"  (1.753 m)   Wt 179 lb (81.2 kg)   BMI 26.43 kg/m  Gen: No  apparent distress, A&O x 3.  Detailed Urogynecologic Evaluation:  Pelvic Exam: Normal external female genitalia; Bartholin's and Skene's glands normal in appearance; urethral meatus normal in appearance, no urethral masses or discharge. The pessary was noted to be in place. It was removed and cleaned. Speculum exam revealed no lesions in the vagina.  She was fit with a new pessary, a 3in incontinence dish with support. It was comfortable, fit well, and stayed in placed with strong cough, valsalva and bending.  Lot # [id number] Exp 09/26/25   Prior POP-Q (10/14/20):  POP-Q  3 Aa  3 Ba  -5 C   3.5 Gh  3.5 Pb  9 tvl   -2.5 Ap  -2.5 Bp  -7 D       Assessment/Plan:    Assessment: Jessica [name] is a 70 y.o. with stage III pelvic organ prolapse and OAB here for a medication and pessary check. She is doing well.  Plan:  OAB - continue Detrol 4mg  tab daily since this is helping with her urgency  POP - She was fitted with a new pessary, a 3in incontinence dish with support. She will keep the pessary in place until next visit. She will follow-up in 3 months for a pessary check or sooner as needed.    Constipation - She will try to get on a more regular regiment with the stool softener and miralax in order to have more regular bowel movements.    All questions were answered. Follow up 3 months  Time spent: I spent 25 minutes  dedicated to the care of this patient on the date of this encounter to include pre-visit review of records, face-to-face time with the patient discussing medications  and post visit documentation. Additional time was spent for the pessary fitting.

## 2020-12-31 ENCOUNTER — Ambulatory Visit (INDEPENDENT_AMBULATORY_CARE_PROVIDER_SITE_OTHER): Payer: Medicare Other | Admitting: Obstetrics and Gynecology

## 2020-12-31 ENCOUNTER — Encounter: Payer: Self-pay | Admitting: Obstetrics and Gynecology

## 2020-12-31 ENCOUNTER — Other Ambulatory Visit: Payer: Self-pay

## 2020-12-31 VITALS — BP 165/94 | HR 86 | Ht 69.0 in | Wt 179.0 lb

## 2020-12-31 DIAGNOSIS — N8110 Cystocele, unspecified: Secondary | ICD-10-CM

## 2020-12-31 DIAGNOSIS — K5900 Constipation, unspecified: Secondary | ICD-10-CM | POA: Diagnosis not present

## 2020-12-31 DIAGNOSIS — N3941 Urge incontinence: Secondary | ICD-10-CM

## 2020-12-31 NOTE — Patient Instructions (Signed)
You were fitted with a incontinence dish pessary with support. Leave in until your next appointment.   Continue taking the detrol 4mg  daily.   Constipation: Our goal is to achieve formed bowel movements daily or every-other-day.  You may need to try different combinations of the following options to find what works best for you - everybody's body works differently so feel free to adjust the dosages as needed.  Some options to help maintain bowel health include:  Marland Kitchen Dietary changes (more leafy greens, vegetables and fruits; less processed foods) . Fiber supplementation (Benefiber, FiberCon, Metamucil or Psyllium). Start slow and increase gradually to full dose. . Over-the-counter agents such as: stool softeners (Docusate or Colace) and/or laxatives (Miralax, milk of magnesia)  . "Power Pudding" is a natural mixture that may help your constipation.  To make blend 1 cup applesauce, 1 cup wheat bran, and 3/4 cup prune juice, refrigerate and then take 1 tablespoon daily with a large glass of water as needed.

## 2021-01-02 ENCOUNTER — Ambulatory Visit (INDEPENDENT_AMBULATORY_CARE_PROVIDER_SITE_OTHER): Payer: Medicare Other | Admitting: Obstetrics and Gynecology

## 2021-01-02 ENCOUNTER — Encounter: Payer: Self-pay | Admitting: Obstetrics and Gynecology

## 2021-01-02 ENCOUNTER — Ambulatory Visit (INDEPENDENT_AMBULATORY_CARE_PROVIDER_SITE_OTHER): Payer: Medicare Other | Admitting: Family

## 2021-01-02 ENCOUNTER — Other Ambulatory Visit: Payer: Self-pay

## 2021-01-02 VITALS — BP 164/90 | HR 87 | Wt 170.4 lb

## 2021-01-02 VITALS — BP 188/109 | HR 86 | Wt 179.0 lb

## 2021-01-02 DIAGNOSIS — N8110 Cystocele, unspecified: Secondary | ICD-10-CM

## 2021-01-02 DIAGNOSIS — I10 Essential (primary) hypertension: Secondary | ICD-10-CM | POA: Diagnosis not present

## 2021-01-02 DIAGNOSIS — N812 Incomplete uterovaginal prolapse: Secondary | ICD-10-CM

## 2021-01-02 MED ORDER — LISINOPRIL 20 MG PO TABS: 20.0000 mg | ORAL_TABLET | Freq: Every day | ORAL | 0 refills | Status: DC

## 2021-01-02 MED ORDER — HYDROCHLOROTHIAZIDE 25 MG PO TABS: 25.0000 mg | ORAL_TABLET | Freq: Every day | ORAL | 0 refills | Status: DC

## 2021-01-02 MED ORDER — METOPROLOL TARTRATE 50 MG PO TABS: 50.0000 mg | ORAL_TABLET | Freq: Two times a day (BID) | ORAL | 0 refills | Status: DC

## 2021-01-02 NOTE — Progress Notes (Signed)
BP elevated at endricrinology visit today been out of meds for 2weeks

## 2021-01-02 NOTE — Progress Notes (Signed)
[facility]   Subjective:     Chief Complaint:  Chief Complaint  Patient presents with  . Pessary Check    Pessary fell out   History of Present Illness: Lidwina Kaner is a 70 y.o. female with stage III pelvic organ prolapse who presents for a pessary check. She is using a size 3in incontinence ring with support pessary.  The pessary fell out while she was voiding. She feels that this pessary has been better than the previous one because she has noticed less incontinence and she would like it replaced. She does not want to learn to replace it herself at this time.   Past Medical History: Patient  has a past medical history of Diabetes mellitus without complication ([place]) and GI bleed (09/08/2019).   Past Surgical History: She  has a past surgical history that includes Colonoscopy (N/A, 09/11/2019); Bunionectomy (Right); and Unilateral salpingectomy.   Medications: She has a current medication list which includes the following prescription(s): onetouch verio, freestyle libre 2 reader, freestyle libre 2 sensor, docusate sodium, trulicity, precision qid test, hydralazine, hydrochlorothiazide, lantus solostar, insulin pen needle, lancets misc., lisinopril, mometasone, onetouch delica lancets 40H, and tolterodine.   Allergies: Patient has No Known Allergies.   Social History: Patient  reports that she has never smoked. She has never used smokeless tobacco. She reports previous alcohol use. She reports that she does not use drugs.      Objective:    Physical Exam: BP (!) 188/109   Pulse 86   Wt 179 lb (81.2 kg)   BMI 26.43 kg/m  Gen: No apparent distress, A&O x 3.  Detailed Urogynecologic Evaluation:  Pelvic Exam: Normal external female genitalia; Bartholin's and Skene's glands normal in appearance; urethral meatus normal in appearance, no urethral masses or discharge. The pessary was replaced. It was comfortable to the patient and fit well.   Prior POP-Q  (10/14/20):  POP-Q  3 Aa  3 Ba  -5 C   3.5 Gh  3.5 Pb  9 tvl   -2.5 Ap  -2.5 Bp  -7 D     Assessment/Plan:    Assessment: Ms. Dershem is a 70 y.o. with stage III pelvic organ prolapse here for a pessary check. She is doing well.  Plan:  3in incontinence ring with support pessary replaced.   Also, she will be in contact with her PCP today regarding her blood pressure and obtaining new medication.   She will follow up as scheduled.    Time Spent: Time spent: I spent 15 minutes dedicated to the care of this patient on the date of this encounter to include pre-visit review of records, face-to-face time with the patient  and post visit documentation.

## 2021-01-02 NOTE — Progress Notes (Signed)
Patient ID: Jessica [name], female    DOB: [birth year]  MRN: [id number]  CC: Hypertension   Subjective: Jessica [name] is a 70 y.o. female who presents for hypertension follow-up.  1. HYPERTENSION FOLLOW-UP: 11/12/2020: Visit at [facility] for hypertension. Patient was hypertensive at 170/91 upon arrival. Physical examination was unremarkable. Patient refused blood work as she only needs medication refills. Patient treated with Hydrochlorothiazide and Lisinopril at the ED. Prescription sent for Lisinopril Toprol, Levemir, Hydrodiuril and Trulicity. Patient is to take medications as prescribed and to return to the ED for worsening symptoms. Patient is agreeable to plans for discharge.  01/02/2021: Visit at Urogynecology at [facility] for pessary check. Blood pressure elevated at 188/109 and referred to PCP to be seen today.   01/03/2021: Reports she has not had any blood pressure medication in 2 weeks. States she was sent here today from Gynecology because of her high blood pressure. Last visit with primary physician for high blood pressure was December 2020. States she is feeling well today.  Currently taking: see medication list Have you taken your blood pressure medication today: []  Yes [x]  No  Med Adherence: []  Yes    [x]  No Medication side effects: []  Yes    [x]  No SOB? []  Yes    [x]  No Chest Pain?: []  Yes    [x]  No Leg swelling?: []  Yes    [x]  No Headaches?: []  Yes    [x]  No Dizziness? []  Yes    [x]  No   Patient Active Problem List   Diagnosis Date Noted  . Cystocele with prolapse 01/30/2020  . Dyslipidemia 12/17/2019  . GBS bacteriuria 12/09/2019  . Uncontrolled type 2 diabetes mellitus with hyperglycemia ([place]) 10/08/2019  . Palpitations 10/08/2019  . Essential hypertension 10/08/2019  . Anemia due to GI blood loss 10/08/2019  . Gastroesophageal reflux disease without esophagitis 10/08/2019  . GI bleed  01/23/2019  . Diabetes ([place]) 01/23/2019     Current Outpatient Medications on File Prior to Visit  Medication Sig Dispense Refill  . Blood Glucose Monitoring Suppl (ONETOUCH VERIO) w/Device KIT Use to monitor blood sugar up to 3 times per day. DM ICD-10 E11.65 and I10 1 kit 0  . Continuous Blood Gluc Receiver (FREESTYLE LIBRE 2 READER) DEVI 1 Device by Does not apply route as directed. 1 each 0  . Continuous Blood Gluc Sensor (FREESTYLE LIBRE 2 SENSOR) MISC 1 Device by Does not apply route as directed. 2 each 11  . docusate sodium (COLACE) 100 MG capsule Take 1 capsule (100 mg total) by mouth 2 (two) times daily. 60 capsule 5  . Dulaglutide (TRULICITY) 1.5 WG/9.5AO SOPN Inject 1.5 mg into the skin once a week. 4 pen 6  . glucose blood (PRECISION QID TEST) test strip Use as instructed to check blood sugars up to 3 times per day    . hydrALAZINE (APRESOLINE) 25 MG tablet Take 25 mg by mouth daily.    . insulin glargine (LANTUS SOLOSTAR) 100 UNIT/ML Solostar Pen Inject 20 Units into the skin daily. 15 mL 6  . Insulin Pen Needle 32G X 4 MM MISC 1 Device by Does not apply route as directed. 100 each 11  . Lancets Misc. MISC Use as directed to check BS three times daily    . mometasone (ELOCON) 0.1 % cream mometasone 0.1 % topical cream    . OneTouch Delica Lancets 13Y MISC Use as directed to check BS three times  daily 100 each 5  . tolterodine (DETROL LA) 4 MG 24 hr capsule Take 1 capsule (4 mg total) by mouth daily. 30 capsule 1   No current facility-administered medications on file prior to visit.    No Known Allergies  Social History   Socioeconomic History  . Marital status: Single    Spouse name: Not on file  . Number of children: Not on file  . Years of education: Not on file  . Highest education level: Not on file  Occupational History  . Not on file  Tobacco Use  . Smoking status: Never Smoker  . Smokeless tobacco: Never Used  Substance and Sexual Activity  . Alcohol use:  Not Currently  . Drug use: Never  . Sexual activity: Not Currently  Other Topics Concern  . Not on file  Social History Narrative  . Not on file   Social Determinants of Health   Financial Resource Strain: Not on file  Food Insecurity: Not on file  Transportation Needs: Not on file  Physical Activity: Not on file  Stress: Not on file  Social Connections: Not on file  Intimate Partner Violence: Not on file    Family History  Problem Relation Age of Onset  . Colon cancer Mother   . Colon polyps Mother     Past Surgical History:  Procedure Laterality Date  . BUNIONECTOMY Right   . COLONOSCOPY N/A 09/11/2019   Procedure: COLONOSCOPY;  Surgeon: Virgel Manifold, MD;  Location: [facility] ENDOSCOPY;  Service: Endoscopy;  Laterality: N/A;  . UNILATERAL SALPINGECTOMY     due to ectopic pregnancy but she does not recall if left or right    ROS: Review of Systems Negative except as stated above  PHYSICAL EXAM: BP (!) 164/90 (BP Location: Right Arm, Patient Position: Sitting)   Pulse 87   Wt 170 lb 6.4 oz (77.3 kg)   SpO2 98%   BMI 25.16 kg/m   Physical Exam HENT:     Head: Normocephalic.  Eyes:     Extraocular Movements: Extraocular movements intact.     Pupils: Pupils are equal, round, and reactive to light.  Cardiovascular:     Rate and Rhythm: Normal rate and regular rhythm.     Pulses: Normal pulses.     Heart sounds: Normal heart sounds.  Pulmonary:     Effort: Pulmonary effort is normal.     Breath sounds: Normal breath sounds.  Musculoskeletal:     Cervical back: Normal range of motion and neck supple. No tenderness.  Neurological:     General: No focal deficit present.     Mental Status: She is alert and oriented to person, place, and time.  Psychiatric:        Mood and Affect: Mood normal.        Behavior: Behavior normal.    ASSESSMENT AND PLAN: 1. Essential hypertension: - Blood pressure not at goal during today's visit. Patient asymptomatic  without chest pressure, chest pain, palpitations, shortness of breath, and headache. - Visit with Urogynecology earlier today with elevated blood pressure and referred to primary care for management. Patient reports she has been without medication for at least 2 weeks. - Patient also previously seen on 11/12/2020 at the emergency department in [place], [place] for the same. - Continue Lisinopril, Hydrochlorothiazide, and Metoprolol Tartrate as prescribed.  - Follow-up in 1 week with clinical pharmacist for blood pressure check. Write down your blood pressure readings each day and bring those results along with  your home blood pressure monitor to your appointment. Medications may be adjusted at that time if needed. - BMP to check kidney function and electrolyte balance. - Follow-up with primary provider as scheduled. - lisinopril (ZESTRIL) 20 MG tablet; Take 1 tablet (20 mg total) by mouth daily.  Dispense: 30 tablet; Refill: 0 - hydrochlorothiazide (HYDRODIURIL) 25 MG tablet; Take 1 tablet (25 mg total) by mouth daily.  Dispense: 30 tablet; Refill: 0 - metoprolol tartrate (LOPRESSOR) 50 MG tablet; Take 1 tablet (50 mg total) by mouth 2 (two) times daily.  Dispense: 60 tablet; Refill: 0 - Basic Metabolic Panel; Future   Patient was given the opportunity to ask questions.  Patient verbalized understanding of the plan and was able to repeat key elements of the plan. Patient was given clear instructions to go to Emergency Department or return to medical center if symptoms don't improve, worsen, or new problems develop.The patient verbalized understanding.   Orders Placed This Encounter  Procedures  . Basic Metabolic Panel     Requested Prescriptions   Signed Prescriptions Disp Refills  . lisinopril (ZESTRIL) 20 MG tablet 30 tablet 0    Sig: Take 1 tablet (20 mg total) by mouth daily.  . hydrochlorothiazide (HYDRODIURIL) 25 MG tablet 30 tablet 0    Sig: Take 1 tablet (25 mg total) by mouth  daily.  . metoprolol tartrate (LOPRESSOR) 50 MG tablet 60 tablet 0    Sig: Take 1 tablet (50 mg total) by mouth 2 (two) times daily.    Return in about 1 week (around 01/09/2021) for clinical pharmacist [facility].  Camillia Herter, NP

## 2021-01-02 NOTE — Patient Instructions (Signed)
Hydrochlorothiazide, Lisinopril, and Metoprolol for high blood pressure.  Blood pressure checkup in 1 week at [address], [address].  Lab today.   Hypertension, Adult Hypertension is another name for high blood pressure. High blood pressure forces your heart to work harder to pump blood. This can cause problems over time. There are two numbers in a blood pressure reading. There is a top number (systolic) over a bottom number (diastolic). It is best to have a blood pressure that is below 120/80. Healthy choices can help lower your blood pressure, or you may need medicine to help lower it. What are the causes? The cause of this condition is not known. Some conditions may be related to high blood pressure. What increases the risk?  Smoking.  Having type 2 diabetes mellitus, high cholesterol, or both.  Not getting enough exercise or physical activity.  Being overweight.  Having too much fat, sugar, calories, or salt (sodium) in your diet.  Drinking too much alcohol.  Having long-term (chronic) kidney disease.  Having a family history of high blood pressure.  Age. Risk increases with age.  Race. You may be at higher risk if you are African American.  Gender. Men are at higher risk than women before age 18. After age 75, women are at higher risk than men.  Having obstructive sleep apnea.  Stress. What are the signs or symptoms?  High blood pressure may not cause symptoms. Very high blood pressure (hypertensive crisis) may cause: ? Headache. ? Feelings of worry or nervousness (anxiety). ? Shortness of breath. ? Nosebleed. ? A feeling of being sick to your stomach (nausea). ? Throwing up (vomiting). ? Changes in how you see. ? Very bad chest pain. ? Seizures. How is this treated?  This condition is treated by making healthy lifestyle changes, such as: ? Eating healthy foods. ? Exercising more. ? Drinking less alcohol.  Your health care provider  may prescribe medicine if lifestyle changes are not enough to get your blood pressure under control, and if: ? Your top number is above 130. ? Your bottom number is above 80.  Your personal target blood pressure may vary. Follow these instructions at home: Eating and drinking  If told, follow the DASH eating plan. To follow this plan: ? Fill one half of your plate at each meal with fruits and vegetables. ? Fill one fourth of your plate at each meal with whole grains. Whole grains include whole-wheat pasta, brown rice, and whole-grain bread. ? Eat or drink low-fat dairy products, such as skim milk or low-fat yogurt. ? Fill one fourth of your plate at each meal with low-fat (lean) proteins. Low-fat proteins include fish, chicken without skin, eggs, beans, and tofu. ? Avoid fatty meat, cured and processed meat, or chicken with skin. ? Avoid pre-made or processed food.  Eat less than 1,500 mg of salt each day.  Do not drink alcohol if: ? Your doctor tells you not to drink. ? You are pregnant, may be pregnant, or are planning to become pregnant.  If you drink alcohol: ? Limit how much you use to:  0-1 drink a day for women.  0-2 drinks a day for men. ? Be aware of how much alcohol is in your drink. In the [place] one drink equals one 12 oz bottle of beer (355 mL), one 5 oz glass of wine (148 mL), or one 1 oz glass of hard liquor (44 mL).   Lifestyle  Work with your doctor  to stay at a healthy weight or to lose weight. Ask your doctor what the best weight is for you.  Get at least 30 minutes of exercise most days of the week. This may include walking, swimming, or biking.  Get at least 30 minutes of exercise that strengthens your muscles (resistance exercise) at least 3 days a week. This may include lifting weights or doing Pilates.  Do not use any products that contain nicotine or tobacco, such as cigarettes, e-cigarettes, and chewing tobacco. If you need help quitting, ask your  doctor.  Check your blood pressure at home as told by your doctor.  Keep all follow-up visits as told by your doctor. This is important.   Medicines  Take over-the-counter and prescription medicines only as told by your doctor. Follow directions carefully.  Do not skip doses of blood pressure medicine. The medicine does not work as well if you skip doses. Skipping doses also puts you at risk for problems.  Ask your doctor about side effects or reactions to medicines that you should watch for. Contact a doctor if you:  Think you are having a reaction to the medicine you are taking.  Have headaches that keep coming back (recurring).  Feel dizzy.  Have swelling in your ankles.  Have trouble with your vision. Get help right away if you:  Get a very bad headache.  Start to feel mixed up (confused).  Feel weak or numb.  Feel faint.  Have very bad pain in your: ? Chest. ? Belly (abdomen).  Throw up more than once.  Have trouble breathing. Summary  Hypertension is another name for high blood pressure.  High blood pressure forces your heart to work harder to pump blood.  For most people, a normal blood pressure is less than 120/80.  Making healthy choices can help lower blood pressure. If your blood pressure does not get lower with healthy choices, you may need to take medicine. This information is not intended to replace advice given to you by your health care provider. Make sure you discuss any questions you have with your health care provider. Document Revised: 08/16/2018 Document Reviewed: 08/16/2018 Elsevier Patient Education  2021 [employer].

## 2021-01-07 ENCOUNTER — Other Ambulatory Visit: Payer: Medicare Other

## 2021-01-07 ENCOUNTER — Ambulatory Visit: Payer: Medicare Other | Admitting: Pharmacist

## 2021-01-07 ENCOUNTER — Ambulatory Visit: Payer: Medicare Other | Admitting: Family

## 2021-01-12 NOTE — Progress Notes (Unsigned)
   S:     PCP: Durene Fruits PMH: HTN, T2DM, hx of palpitations  Patient arrives in good spirits. Presents to the clinic for hypertension evaluation, counseling, and management.  Patient was referred and last seen by Primary Care Provider on 01/03/20 with elevated BP 164/90 and has not taken any blood pressure medication in 2 weeks. Pt instructed to restart medications.  Today, patient reports ***  Need labs today -- ordered already! BMET -Scr 0.98 in March 2021  Taking hydralazine??? Dosed once daily  Compliance? Took meds this morning? When do you take your meds? Dizziness, headaches, blurred vision? History of swelling? Check Clinic BP? Home BP logs? If no logs, bring to next visit w/ BP cuff Go over BP goals Additional BP therapy if needed .  Diet??  Exercise??    Medication adherence *** .  Current BP Medications include:  Lisinopril 20 mg daily, HCTZ 25 mg daily, metoprolol tartrate 50 mg BID, hydralazine 25 mg daily  Antihypertensives tried in the past include: none  Dietary habits include: *** Exercise habits include:*** Family / Social history:  -Fhx: colon cancer in mother -Tobacco use: denies   O:   Home BP readings: ***  Last 3 Office BP readings: BP Readings from Last 3 Encounters:  01/02/21 (!) 164/90  01/02/21 (!) 188/109  12/31/20 (!) 165/94    BMET    Component Value Date/Time   NA 140 10/08/2019 1331   K 4.3 10/08/2019 1331   CL 105 10/08/2019 1331   CO2 22 10/08/2019 1331   GLUCOSE 175 (H) 10/08/2019 1331   GLUCOSE 121 (H) 09/11/2019 0506   BUN 7 (L) 10/08/2019 1331   CREATININE 0.98 03/04/2020 1200   CALCIUM 10.4 (H) 10/08/2019 1331   GFRNONAA 60 (L) 03/04/2020 1200   GFRAA >60 03/04/2020 1200    Renal function: CrCl cannot be calculated (Patient's most recent lab result is older than the maximum 21 days allowed.).  Clinical ASCVD: No  The 10-year ASCVD risk score Mikey Bussing DC Jr., et al., 2013) is: 44.9%   Values used to  calculate the score:     Age: 70 years     Sex: Female     Is Non-Hispanic African American: Yes     Diabetic: Yes     Tobacco smoker: No     Systolic Blood Pressure: 638 mmHg     Is BP treated: Yes     HDL Cholesterol: 76 mg/dL     Total Cholesterol: 263 mg/dL  PHQ-2 Score: ***   A/P: Hypertension diagnosed *** currently *** on current medications. BP Goal = < *** mmHg. Medication adherence ***.  -{Meds adjust:18428} ***.  -F/u labs ordered - *** -Counseled on lifestyle modifications for blood pressure control including reduced dietary sodium, increased exercise, adequate sleep.  Results reviewed and written information provided.   Total time in face-to-face counseling *** minutes.   F/U Clinic Visit in ***.  Patient seen with ***  Lorel Monaco, PharmD, BCPS PGY2 Ambulatory Care Resident [place]

## 2021-01-14 ENCOUNTER — Ambulatory Visit: Payer: Medicare Other | Admitting: Pharmacist

## 2021-01-14 ENCOUNTER — Other Ambulatory Visit: Payer: Medicare Other

## 2021-02-11 ENCOUNTER — Other Ambulatory Visit: Payer: Self-pay

## 2021-02-11 ENCOUNTER — Encounter (HOSPITAL_BASED_OUTPATIENT_CLINIC_OR_DEPARTMENT_OTHER): Payer: Self-pay | Admitting: Obstetrics & Gynecology

## 2021-02-11 ENCOUNTER — Ambulatory Visit (INDEPENDENT_AMBULATORY_CARE_PROVIDER_SITE_OTHER): Payer: Medicare Other | Admitting: Obstetrics & Gynecology

## 2021-02-11 ENCOUNTER — Ambulatory Visit: Payer: Medicare Other | Attending: Family Medicine

## 2021-02-11 VITALS — BP 168/105 | HR 93 | Wt 175.0 lb

## 2021-02-11 DIAGNOSIS — T839XXA Unspecified complication of genitourinary prosthetic device, implant and graft, initial encounter: Secondary | ICD-10-CM

## 2021-02-11 DIAGNOSIS — N814 Uterovaginal prolapse, unspecified: Secondary | ICD-10-CM | POA: Diagnosis not present

## 2021-02-11 DIAGNOSIS — N3941 Urge incontinence: Secondary | ICD-10-CM | POA: Diagnosis not present

## 2021-02-11 DIAGNOSIS — I10 Essential (primary) hypertension: Secondary | ICD-10-CM

## 2021-02-12 LAB — BASIC METABOLIC PANEL
BUN/Creatinine Ratio: 10 — ABNORMAL LOW (ref 12–28)
BUN: 10 mg/dL (ref 8–27)
CO2: 22 mmol/L (ref 20–29)
Calcium: 10.3 mg/dL (ref 8.7–10.3)
Chloride: 103 mmol/L (ref 96–106)
Creatinine, Ser: 1.04 mg/dL — ABNORMAL HIGH (ref 0.57–1.00)
GFR calc Af Amer: 64 mL/min/{1.73_m2} (ref >59)
GFR calc non Af Amer: 55 mL/min/{1.73_m2} — ABNORMAL LOW (ref >59)
Glucose: 235 mg/dL — ABNORMAL HIGH (ref 65–99)
Potassium: 4 mmol/L (ref 3.5–5.2)
Sodium: 141 mmol/L (ref 134–144)

## 2021-02-12 NOTE — Progress Notes (Signed)
Please call patient with update.   Kidney function normal.

## 2021-02-13 ENCOUNTER — Encounter (HOSPITAL_BASED_OUTPATIENT_CLINIC_OR_DEPARTMENT_OTHER): Payer: Self-pay | Admitting: Obstetrics & Gynecology

## 2021-02-13 NOTE — Progress Notes (Signed)
70 y.o. Single Black female [phone] here for pessary placement.  She is being followed by Dr. Wannetta Sender for uterovaginal prolapse and cystocel.  Pt was see on 01/02/2021 and dish pessary was placed.  Pt reports she pessary came out about two days later.  States it felt like it was going to come out every time she had a bowel movement until it actually came out.  Pt would like pessary replaced.  Cannot do it herself at this time.  Denies vaginal bleeding or discharge.  Pt is leaving for [place] (to try and close on her house as she's moved to [facility]) and would like pessary replaced prior to leaving.  She is not having trouble with voiding.  Denies vaginal bleeding.  No LMP recorded. Patient is postmenopausal. Menopausal hormone therapy: none  Patient Active Problem List   Diagnosis Date Noted  . Cystocele with prolapse 01/30/2020  . Dyslipidemia 12/17/2019  . GBS bacteriuria 12/09/2019  . Uncontrolled type 2 diabetes mellitus with hyperglycemia ([place]) 10/08/2019  . Palpitations 10/08/2019  . Essential hypertension 10/08/2019  . Anemia due to GI blood loss 10/08/2019  . Gastroesophageal reflux disease without esophagitis 10/08/2019  . GI bleed 01/23/2019  . Diabetes ([place]) 01/23/2019    Past Medical History:  Diagnosis Date  . Diabetes mellitus without complication ([place])   . GI bleed 09/08/2019    Past Surgical History:  Procedure Laterality Date  . BUNIONECTOMY Right   . COLONOSCOPY N/A 09/11/2019   Procedure: COLONOSCOPY;  Surgeon: Virgel Manifold, MD;  Location: [facility] ENDOSCOPY;  Service: Endoscopy;  Laterality: N/A;  . UNILATERAL SALPINGECTOMY     due to ectopic pregnancy but she does not recall if left or right    MEDS:   Current Outpatient Medications on File Prior to Visit  Medication Sig Dispense Refill  . Blood Glucose Monitoring Suppl (ONETOUCH VERIO) w/Device KIT Use to monitor blood sugar up to 3 times per day. DM ICD-10 E11.65 and I10 1 kit 0  . Continuous Blood Gluc  Receiver (FREESTYLE LIBRE 2 READER) DEVI 1 Device by Does not apply route as directed. 1 each 0  . Continuous Blood Gluc Sensor (FREESTYLE LIBRE 2 SENSOR) MISC 1 Device by Does not apply route as directed. 2 each 11  . docusate sodium (COLACE) 100 MG capsule Take 1 capsule (100 mg total) by mouth 2 (two) times daily. 60 capsule 5  . Dulaglutide (TRULICITY) 1.5 OV/5.6EP SOPN Inject 1.5 mg into the skin once a week. 4 pen 6  . glucose blood (PRECISION QID TEST) test strip Use as instructed to check blood sugars up to 3 times per day    . hydrALAZINE (APRESOLINE) 25 MG tablet Take 25 mg by mouth daily.    . hydrochlorothiazide (HYDRODIURIL) 25 MG tablet Take 1 tablet (25 mg total) by mouth daily. 30 tablet 0  . insulin glargine (LANTUS SOLOSTAR) 100 UNIT/ML Solostar Pen Inject 20 Units into the skin daily. 15 mL 6  . Insulin Pen Needle 32G X 4 MM MISC 1 Device by Does not apply route as directed. 100 each 11  . Lancets Misc. MISC Use as directed to check BS three times daily    . lisinopril (ZESTRIL) 20 MG tablet Take 1 tablet (20 mg total) by mouth daily. 30 tablet 0  . metoprolol tartrate (LOPRESSOR) 50 MG tablet Take 1 tablet (50 mg total) by mouth 2 (two) times daily. 60 tablet 0  . mometasone (ELOCON) 0.1 % cream mometasone 0.1 % topical cream    .  OneTouch Delica Lancets 98V MISC Use as directed to check BS three times daily 100 each 5  . tolterodine (DETROL LA) 4 MG 24 hr capsule Take 1 capsule (4 mg total) by mouth daily. 30 capsule 1   No current facility-administered medications on file prior to visit.    ALLERGIES: Patient has no known allergies.  Family History  Problem Relation Age of Onset  . Colon cancer Mother   . Colon polyps Mother     SH:  Single, non smoker  Review of Systems  Constitutional: Negative.   Gastrointestinal: Negative.   Genitourinary: Negative.     PHYSICAL EXAMINATION:    BP (!) 168/105   Pulse 93   Wt 175 lb (79.4 kg)   BMI 25.84 kg/m      General appearance: alert, cooperative and appears stated age Lymph:  no inguinal LAD noted  Pelvic: External genitalia:  no lesions              Urethra:  normal appearing urethra with no masses, tenderness or lesions              Bartholins and Skenes: normal                 Vagina: bladder prolapsed through vagina on exam, no lesions on vaginal mucosa              Cervix: no lesions              Bimanual Exam:  Uterus:  normal size, contour, position, consistency, mobility, non-tender              Adnexa: no mass, fullness, tenderness  Pessary replaced without difficulty.    Chaperone, Shela Nevin, RN, was present for exam.  Assessment/Plan: 1. Cystocele with prolapse - Pessary replaced today.  Feel she is going to need a larger pessary or pessary that is more still so will not come out as easily. - she is going to go back to [place] and then will call for follow up with Dr. Wannetta Sender.  2. Urge incontinence of urine  3. Problem with vaginal pessary, initial encounter ([place])

## 2021-03-11 ENCOUNTER — Ambulatory Visit: Payer: Medicare Other | Attending: Physician Assistant | Admitting: Physician Assistant

## 2021-03-11 ENCOUNTER — Other Ambulatory Visit: Payer: Self-pay

## 2021-03-11 NOTE — Progress Notes (Deleted)
Patient ID: Jessica [name], female   DOB: May 14, 1951, 69 y.o.   MRN: [id number]

## 2021-03-27 ENCOUNTER — Encounter: Payer: Self-pay | Admitting: Obstetrics and Gynecology

## 2021-03-27 ENCOUNTER — Other Ambulatory Visit: Payer: Self-pay

## 2021-03-27 ENCOUNTER — Ambulatory Visit (INDEPENDENT_AMBULATORY_CARE_PROVIDER_SITE_OTHER): Payer: Medicare Other | Admitting: Obstetrics and Gynecology

## 2021-03-27 VITALS — BP 161/106 | HR 86 | Wt 175.0 lb

## 2021-03-27 DIAGNOSIS — N8110 Cystocele, unspecified: Secondary | ICD-10-CM | POA: Diagnosis not present

## 2021-03-27 DIAGNOSIS — K5900 Constipation, unspecified: Secondary | ICD-10-CM | POA: Diagnosis not present

## 2021-03-27 DIAGNOSIS — N3941 Urge incontinence: Secondary | ICD-10-CM

## 2021-03-27 DIAGNOSIS — N812 Incomplete uterovaginal prolapse: Secondary | ICD-10-CM | POA: Diagnosis not present

## 2021-03-27 MED ORDER — POLYETHYLENE GLYCOL 3350 17 GM/SCOOP PO POWD: 1.0000 | Freq: Every day | ORAL | 11 refills | Status: DC

## 2021-03-27 NOTE — Patient Instructions (Signed)
You were fitted with a incontinence dish pessary   You can take it out every week, or sooner if you desire. Clean with liquid soap and water in between uses and leave out over night on the night that you remove it. Call for any problems.   Use Miralax every day- dissolve powder in water. If stool becomes too soft, you can decrease to every other day.

## 2021-03-27 NOTE — Progress Notes (Signed)
[facility]   Subjective:     Chief Complaint:  Chief Complaint  Patient presents with  . Follow-up    Pessary   History of Present Illness: Jessica [name] is a 70 y.o. female with stage III pelvic organ prolapse, stress incontinence and OAB who presents for a pessary check. She is using a size 3in incontinence dish with support pessary.  The pessary has fallen out a few weeks ago while she was in [place] and she would like it replaced. Overall it stayed in well until that point. She would like to learn how to place it herself.   She has not been Detrol LA 4mg  for her OAB- she threw it out because she did not think it was helping.   She also reports constipation and straining. Has been using metamucil daily but has not started miralax.    Past Medical History: Patient  has a past medical history of Diabetes mellitus without complication ([place]) and GI bleed (09/08/2019).   Past Surgical History: She  has a past surgical history that includes Colonoscopy (N/A, 09/11/2019); Bunionectomy (Right); and Unilateral salpingectomy.   Medications: She has a current medication list which includes the following prescription(s): onetouch verio, freestyle libre 2 reader, freestyle libre 2 sensor, docusate sodium, trulicity, precision qid test, hydralazine, hydrochlorothiazide, lantus solostar, insulin pen needle, lancets misc., lisinopril, metoprolol tartrate, mometasone, onetouch delica lancets 73U, and tolterodine.   Allergies: Patient has No Known Allergies.   Social History: Patient  reports that she has never smoked. She has never used smokeless tobacco. She reports previous alcohol use. She reports that she does not use drugs.     Objective:    Physical Exam: BP (!) 161/106   Pulse 86   Wt 175 lb (79.4 kg)   BMI 25.84 kg/m  Gen: No apparent distress, A&O x 3.  Detailed Urogynecologic Evaluation:  Pelvic Exam: Normal external female genitalia; Bartholin's and Skene's  glands normal in appearance; urethral meatus normal in appearance, no urethral masses or discharge.   A new pessary was fitted- 3-1/8in incontinence dish with support. She was able to place herself. It was comfortable, fit well, and stayed in placed with strong cough, valsalva and bending.  Lot # [id number] Exp 09/05/25  Prior POP-Q(10/14/20):  POP-Q  3 Aa  3 Ba  -5 C   3.5 Gh  3.5 Pb  9 tvl   -2.5 Ap  -2.5 Bp  -7 D       Assessment/Plan:    Assessment: Jessica [name] is a 70 y.o. with stage III pelvic organ prolapse, stress incontinence and OAB as well as constipation.    Plan:  POP/ SUI - New pessary 3-18 in incontinence dish placed today. She will remove weekly and wash with soap and water.   OAB - not bothersome to her at this time and she feels like she is on too many medications to start another one  Constipation - She will start miralax daily in addition to the metamucil. Prescription provided.    Return 1 month to check pessary   Time Spent: Time spent: I spent 25 minutes dedicated to the care of this patient on the date of this encounter to include pre-visit review of records, face-to-face time with the patient and post visit documentation. Additional time was spent for the pessary fitting.

## 2021-03-29 ENCOUNTER — Other Ambulatory Visit: Payer: Self-pay | Admitting: Family

## 2021-03-29 ENCOUNTER — Other Ambulatory Visit: Payer: Self-pay | Admitting: Internal Medicine

## 2021-03-29 DIAGNOSIS — I10 Essential (primary) hypertension: Secondary | ICD-10-CM

## 2021-04-01 ENCOUNTER — Ambulatory Visit: Payer: Medicare Other | Admitting: Obstetrics and Gynecology

## 2021-04-21 ENCOUNTER — Telehealth: Payer: Self-pay | Admitting: Obstetrics and Gynecology

## 2021-05-08 ENCOUNTER — Ambulatory Visit: Payer: Medicare Other | Admitting: Obstetrics and Gynecology

## 2021-06-28 ENCOUNTER — Other Ambulatory Visit: Payer: Self-pay

## 2021-06-28 ENCOUNTER — Ambulatory Visit
Admission: EM | Admit: 2021-06-28 | Discharge: 2021-06-28 | Discharge status: Against Medical Advice | Payer: Medicare Other

## 2021-06-28 NOTE — Progress Notes (Signed)
Patient ID: Jessica [name], female    DOB: Sep 15, 1951  MRN: [id number]  CC: Hypertension and Diabetes Follow-Up  Subjective: Jessica [name] is a 70 y.o. female who presents for hypertension and diabetes follow-up.   Her concerns today include:  HYPERTENSION FOLLOW-UP: 01/02/2021: - Visit with Urogynecology earlier today with elevated blood pressure and referred to primary care for management. Patient reports she has been without medication for at least 2 weeks. - Patient also previously seen on 11/12/2020 at the emergency department in [place], [place] for the same. - Continue Lisinopril, Hydrochlorothiazide, and Metoprolol Tartrate as prescribed. - Follow-up in 1 week with clinical pharmacist for blood pressure check.  Visit 04/25/2021 at [facility] per DO note: In summary, Jessica [name] is a 70 y.o. female who presents for evaluation of nausea and lightheadedness described as "swerving" starting this afternoon at 1pm with similar episodes in the past related to high blood pressure. Pt ran out of blood pressure medications 4 weeks ago.  Please refer to the ED Course above as additional clinical information may be located there.   Laboratory studies obtained, including troponin, CMP and CBC, and UA. Imaging studies ordered and reviewed, including CT head and CXR. EKG also ordered.  All laboratory and imaging studies were reviewed with the patient at the bedside. She was provided a dose of her hydrochlorothiazide as well as lisinopril and is feeling significantly better. She would like to be discharged home. Refills will be sent to her pharmacy.  Patient advised to follow-up with closely with their primary care provider. They should return to the emergency department for any new or worsening symptoms. All questions were answered. The patient remained stable throughout their course in the emergency department.    Visit 06/28/2021 at [facility]: Patient left without being seen before Triage.  06/29/2021: Reports she left the Urgent Care on yesterday because of the 3 hour long wait. Unsure of which blood pressure medications she is supposed to be taking. Endorses that she does remember the water pill for sure and possibly the Lisinopril. Endorses headaches. Currently taking: see medication list Med Adherence: ran out of medication at least 1.5 weeks ago Medication side effects: [] Yes    [x] No Adherence with salt restriction (low-salt diet): [x] Yes [] No Exercise: Yes [x] No [] Home Monitoring?: [] Yes    [x] No Monitoring Frequency: [] Yes    [x] No Home BP results range: [] Yes    [x] No Smoking [] Yes [x] No SOB? [] Yes    [x] No Chest Pain?: [] Yes    [x] No   2. DIABETES TYPE 2: Last A1C:   Results for orders placed or performed in visit on 06/29/21  POCT glycosylated hemoglobin (Hb A1C)  Result Value Ref Range   Hemoglobin A1C 13.2 (A) 4.0 - 5.6 %   HbA1c POC (<> result, manual entry)     HbA1c, POC (prediabetic range)     HbA1c, POC (controlled diabetic range)     Med Adherence:  [x] Yes    [] No Medication side effects:  [] Yes    [x] No Home Monitoring?  [] Yes    [x] No Home glucose results range: none Diet Adherence: reports was eating out for at least 6 months while in [place] Exercise: [x] Yes    [] No Comments: Reports she Metformin made her sick. She was taking  another medication (she cannot recall the name) which caused side effects. Requesting a non-stick meter for blood sugar monitoring.   Patient Active Problem List   Diagnosis Date Noted   Cystocele with prolapse 01/30/2020   Dyslipidemia 12/17/2019   GBS bacteriuria 12/09/2019   Uncontrolled type 2 diabetes mellitus with hyperglycemia ([place]) 10/08/2019   Palpitations 10/08/2019   Essential hypertension 10/08/2019   Anemia due to GI blood loss 10/08/2019   Gastroesophageal reflux disease without esophagitis  10/08/2019   GI bleed 01/23/2019   Diabetes ([place]) 01/23/2019     Current Outpatient Medications on File Prior to Visit  Medication Sig Dispense Refill   Blood Glucose Monitoring Suppl (ONETOUCH VERIO) w/Device KIT Use to monitor blood sugar up to 3 times per day. DM ICD-10 E11.65 and I10 1 kit 0   glucose blood (PRECISION QID TEST) test strip Use as instructed to check blood sugars up to 3 times per day     hydrALAZINE (APRESOLINE) 25 MG tablet Take 25 mg by mouth daily.     Lancets Misc. MISC Use as directed to check BS three times daily     mometasone (ELOCON) 0.1 % cream mometasone 0.1 % topical cream     OneTouch Delica Lancets 16X MISC Use as directed to check BS three times daily 100 each 5   polyethylene glycol powder (GLYCOLAX/MIRALAX) 17 GM/SCOOP powder Take 255 g by mouth daily. Drink 17g (1 scoop) dissolved in water per day. 255 g 11   tolterodine (DETROL LA) 4 MG 24 hr capsule Take 1 capsule (4 mg total) by mouth daily. 30 capsule 1   No current facility-administered medications on file prior to visit.    No Known Allergies  Social History   Socioeconomic History   Marital status: Single    Spouse name: Not on file   Number of children: Not on file   Years of education: Not on file   Highest education level: Not on file  Occupational History   Not on file  Tobacco Use   Smoking status: Never   Smokeless tobacco: Never  Vaping Use   Vaping Use: Never used  Substance and Sexual Activity   Alcohol use: Not Currently   Drug use: Never   Sexual activity: Not Currently  Other Topics Concern   Not on file  Social History Narrative   Not on file   Social Determinants of Health   Financial Resource Strain: Not on file  Food Insecurity: Not on file  Transportation Needs: Not on file  Physical Activity: Not on file  Stress: Not on file  Social Connections: Not on file  Intimate Partner Violence: Not on file    Family History  Problem Relation Age of Onset    Colon cancer Mother    Colon polyps Mother     Past Surgical History:  Procedure Laterality Date   BUNIONECTOMY Right    COLONOSCOPY N/A 09/11/2019   Procedure: COLONOSCOPY;  Surgeon: Virgel Manifold, MD;  Location: [facility] ENDOSCOPY;  Service: Endoscopy;  Laterality: N/A;   UNILATERAL SALPINGECTOMY     due to ectopic pregnancy but she does not recall if left or right    ROS: Review of Systems Negative except as stated above  PHYSICAL EXAM: BP (!) 176/106 (BP Location: Left Arm, Patient Position: Sitting, Cuff Size: Normal)   Pulse 75   Temp 98.1 F (36.7 C)   Resp 16   Ht 5' 7.4" (1.712 m)   Wt 171 lb 12.8 oz (77.9  kg)   SpO2 98%   BMI 26.59 kg/m   Physical Exam HENT:     Head: Normocephalic and atraumatic.  Eyes:     Extraocular Movements: Extraocular movements intact.     Conjunctiva/sclera: Conjunctivae normal.     Pupils: Pupils are equal, round, and reactive to light.  Cardiovascular:     Rate and Rhythm: Normal rate and regular rhythm.     Pulses: Normal pulses.     Heart sounds: Normal heart sounds.  Pulmonary:     Effort: Pulmonary effort is normal.     Breath sounds: Normal breath sounds.  Musculoskeletal:     Cervical back: Normal range of motion and neck supple.  Neurological:     General: No focal deficit present.     Mental Status: She is alert and oriented to person, place, and time.  Psychiatric:        Mood and Affect: Mood normal.        Behavior: Behavior normal.   ASSESSMENT AND PLAN: 1. Essential hypertension: - Blood pressure not at goal during today's visit. Patient asymptomatic without chest pressure, chest pain, palpitations, shortness of breath, and worst headache of life. Patient has been at least 1.5 weeks without blood pressure medications. She is an unreliable historian in regards to the blood pressure medications she has been taking. Patient does not monitor blood pressure in the home setting.  - There has been a gap in care as  patient was unexpectedly in [place] for a few months and just returned to [place] on last week.  - Increase Hydrochlorothiazide from 25 mg daily to 50 mg daily.  - Increase Lisinopril from 20 mg daily to 40 mg daily.  - Restart Metoprolol Tartrate as prescribed.  - Counseled on blood pressure goal of less than 130/80, low-sodium, DASH diet, medication compliance, 150 minutes of moderate intensity exercise per week as tolerated. Discussed medication compliance, adverse effects. - BMP to evaluate kidney function and electrolyte balance. - Follow-up in 1 week with clinical pharmacist for blood pressure check. Medications may be adjusted at that time if needed. - Referral to Cardiology for further evaluation and management.  - Basic Metabolic Panel - hydrochlorothiazide (HYDRODIURIL) 50 MG tablet; Take 1 tablet (50 mg total) by mouth daily.  Dispense: 90 tablet; Refill: 0 - lisinopril (ZESTRIL) 40 MG tablet; Take 1 tablet (40 mg total) by mouth daily.  Dispense: 90 tablet; Refill: 0 - metoprolol tartrate (LOPRESSOR) 50 MG tablet; Take 1 tablet (50 mg total) by mouth 2 (two) times daily.  Dispense: 180 tablet; Refill: 0 - Ambulatory referral to Cardiology  2. Uncontrolled type 2 diabetes mellitus with hyperglycemia ([place]): - Hemoglobin A1c not at goal today at 13.2%, goal < 8%. This is increased from previous hemoglobin A1c of  8.1% on 04/09/2020. Next hemoglobin A1c due October 2022.  - Increase Dulaglutide from 1.5 mg weekly to 3 mg weekly.  - Increase Insulin Glargine from 20 units at bedtime to 25 units at bedtime.  - Patient reports she is intolerant to Metformin. She is also intolerant to another diabetes medication but she is unsure of the name.  - Per patient request Freestyle Libre reader and sensor ordered for blood sugar monitoring. - Discussed the importance of healthy eating habits, low-carbohydrate diet, low-sugar diet, regular aerobic exercise (at least 150 minutes a week as  tolerated) and medication compliance to achieve or maintain control of diabetes. - Follow-up with clinical pharmacist in 4 weeks for diabetes checkup. Write  your home blood sugar results down each day and bring those results to your appointment along with your home glucose monitor. Medications may be revised at that time if needed. - Referral to Medical Nutrition Therapy for further management.  - Referral to Endocrinology for further evaluation and management.  - POCT glycosylated hemoglobin (Hb A1C) - Dulaglutide (TRULICITY) 3 NB/5.6PO SOPN; Inject 3 mg as directed once a week.  Dispense: 6 mL; Refill: 0 - Insulin Pen Needle (PEN NEEDLES) 31G X 8 MM MISC; UAD  Dispense: 100 each; Refill: 6 - insulin glargine (LANTUS SOLOSTAR) 100 UNIT/ML Solostar Pen; Inject 25 Units into the skin at bedtime.  Dispense: 30 mL; Refill: 0 - Amb ref to Medical Nutrition Therapy-MNT - Ambulatory referral to Endocrinology - Continuous Blood Gluc Receiver (FREESTYLE LIBRE 2 READER) DEVI; 1 Device by Does not apply route as directed.  Dispense: 1 each; Refill: 0 - Continuous Blood Gluc Sensor (FREESTYLE LIBRE 2 SENSOR) MISC; 1 Device by Does not apply route as directed.  Dispense: 2 each; Refill: 11    Patient was given the opportunity to ask questions.  Patient verbalized understanding of the plan and was able to repeat key elements of the plan. Patient was given clear instructions to go to Emergency Department or return to medical center if symptoms don't improve, worsen, or new problems develop.The patient verbalized understanding.   Orders Placed This Encounter  Procedures   Basic Metabolic Panel   Amb ref to Medical Nutrition Therapy-MNT   Ambulatory referral to Cardiology   Ambulatory referral to Endocrinology   POCT glycosylated hemoglobin (Hb A1C)     Requested Prescriptions   Signed Prescriptions Disp Refills   Dulaglutide (TRULICITY) 3 LI/1.0VU SOPN 6 mL 0    Sig: Inject 3 mg as directed once a  week.   Insulin Pen Needle (PEN NEEDLES) 31G X 8 MM MISC 100 each 6    Sig: UAD   insulin glargine (LANTUS SOLOSTAR) 100 UNIT/ML Solostar Pen 30 mL 0    Sig: Inject 25 Units into the skin at bedtime.   hydrochlorothiazide (HYDRODIURIL) 50 MG tablet 90 tablet 0    Sig: Take 1 tablet (50 mg total) by mouth daily.   lisinopril (ZESTRIL) 40 MG tablet 90 tablet 0    Sig: Take 1 tablet (40 mg total) by mouth daily.   metoprolol tartrate (LOPRESSOR) 50 MG tablet 180 tablet 0    Sig: Take 1 tablet (50 mg total) by mouth 2 (two) times daily.   Continuous Blood Gluc Receiver (FREESTYLE LIBRE 2 READER) DEVI 1 each 0    Sig: 1 Device by Does not apply route as directed.   Continuous Blood Gluc Sensor (FREESTYLE LIBRE 2 SENSOR) MISC 2 each 11    Sig: 1 Device by Does not apply route as directed.    Return in about 1 week (around 07/06/2021) for Follow-Up hypertension and 4 weeks diabetes (both appts. with Lurena Joiner at [facility]) .  Camillia Herter, NP

## 2021-06-29 ENCOUNTER — Other Ambulatory Visit: Payer: Self-pay

## 2021-06-29 ENCOUNTER — Ambulatory Visit (INDEPENDENT_AMBULATORY_CARE_PROVIDER_SITE_OTHER): Payer: Medicare Other | Admitting: Family

## 2021-06-29 ENCOUNTER — Encounter: Payer: Self-pay | Admitting: Family

## 2021-06-29 VITALS — BP 176/106 | HR 75 | Temp 98.1°F | Resp 16 | Ht 67.4 in | Wt 171.8 lb

## 2021-06-29 DIAGNOSIS — E1165 Type 2 diabetes mellitus with hyperglycemia: Secondary | ICD-10-CM

## 2021-06-29 DIAGNOSIS — I10 Essential (primary) hypertension: Secondary | ICD-10-CM | POA: Diagnosis not present

## 2021-06-29 LAB — POCT GLYCOSYLATED HEMOGLOBIN (HGB A1C): Hemoglobin A1C: 13.2 % — AB (ref 4.0–5.6)

## 2021-06-29 MED ORDER — HYDROCHLOROTHIAZIDE 50 MG PO TABS
50.0000 mg | ORAL_TABLET | Freq: Every day | ORAL | 0 refills | Status: DC
Start: 2021-06-29 — End: 2021-07-04

## 2021-06-29 MED ORDER — PEN NEEDLES 31G X 8 MM MISC
6 refills | Status: DC
Start: 2021-06-29 — End: 2021-08-21

## 2021-06-29 MED ORDER — METOPROLOL TARTRATE 50 MG PO TABS: 50.0000 mg | ORAL_TABLET | Freq: Two times a day (BID) | ORAL | 0 refills | Status: DC

## 2021-06-29 MED ORDER — FREESTYLE LIBRE 2 SENSOR MISC: 1.0000 | 11 refills | Status: DC

## 2021-06-29 MED ORDER — LANTUS SOLOSTAR 100 UNIT/ML ~~LOC~~ SOPN: 25.0000 [IU] | PEN_INJECTOR | Freq: Every day | SUBCUTANEOUS | 0 refills | Status: DC

## 2021-06-29 MED ORDER — FREESTYLE LIBRE 2 READER DEVI: 1.0000 | 0 refills | Status: DC

## 2021-06-29 MED ORDER — TRULICITY 3 MG/0.5ML ~~LOC~~ SOAJ: 3.0000 mg | SUBCUTANEOUS | 0 refills | Status: DC

## 2021-06-29 MED ORDER — LISINOPRIL 40 MG PO TABS: 40.0000 mg | ORAL_TABLET | Freq: Every day | ORAL | 0 refills | Status: DC

## 2021-06-29 NOTE — Progress Notes (Signed)
Pt presents for hypertension and diabetes f/u, pt not sure what BP meds she is taking not sure if she is on three or four different meds for it  Symptoms include headache and dizziness

## 2021-07-04 ENCOUNTER — Emergency Department (HOSPITAL_BASED_OUTPATIENT_CLINIC_OR_DEPARTMENT_OTHER)
Admission: EM | Admit: 2021-07-04 | Discharge: 2021-07-04 | Disposition: A | Discharge status: Home / Self Care | Payer: Medicare Other | Attending: Emergency Medicine | Admitting: Emergency Medicine

## 2021-07-04 ENCOUNTER — Encounter (HOSPITAL_BASED_OUTPATIENT_CLINIC_OR_DEPARTMENT_OTHER): Payer: Self-pay | Admitting: Emergency Medicine

## 2021-07-04 DIAGNOSIS — R531 Weakness: Secondary | ICD-10-CM | POA: Insufficient documentation

## 2021-07-04 DIAGNOSIS — Z794 Long term (current) use of insulin: Secondary | ICD-10-CM | POA: Insufficient documentation

## 2021-07-04 DIAGNOSIS — Z79899 Other long term (current) drug therapy: Secondary | ICD-10-CM | POA: Diagnosis not present

## 2021-07-04 DIAGNOSIS — I959 Hypotension, unspecified: Secondary | ICD-10-CM | POA: Insufficient documentation

## 2021-07-04 DIAGNOSIS — E119 Type 2 diabetes mellitus without complications: Secondary | ICD-10-CM | POA: Diagnosis not present

## 2021-07-04 DIAGNOSIS — I10 Essential (primary) hypertension: Secondary | ICD-10-CM | POA: Diagnosis not present

## 2021-07-04 DIAGNOSIS — R0602 Shortness of breath: Secondary | ICD-10-CM | POA: Insufficient documentation

## 2021-07-04 DIAGNOSIS — I952 Hypotension due to drugs: Secondary | ICD-10-CM

## 2021-07-04 LAB — CBC WITH DIFFERENTIAL/PLATELET
Abs Immature Granulocytes: 0.02 10*3/uL (ref 0.00–0.07)
Basophils Absolute: 0 10*3/uL (ref 0.0–0.1)
Basophils Relative: 1 %
Eosinophils Absolute: 0.1 10*3/uL (ref 0.0–0.5)
Eosinophils Relative: 1 %
HCT: 42.5 % (ref 36.0–46.0)
Hemoglobin: 13.9 g/dL (ref 12.0–15.0)
Immature Granulocytes: 0 %
Lymphocytes Relative: 31 %
Lymphs Abs: 1.9 10*3/uL (ref 0.7–4.0)
MCH: 27.5 pg (ref 26.0–34.0)
MCHC: 32.7 g/dL (ref 30.0–36.0)
MCV: 84 fL (ref 80.0–100.0)
Monocytes Absolute: 0.5 10*3/uL (ref 0.1–1.0)
Monocytes Relative: 8 %
Neutro Abs: 3.6 10*3/uL (ref 1.7–7.7)
Neutrophils Relative %: 59 %
Platelets: 285 10*3/uL (ref 150–400)
RBC: 5.06 MIL/uL (ref 3.87–5.11)
RDW: 12.5 % (ref 11.5–15.5)
WBC: 6 10*3/uL (ref 4.0–10.5)
nRBC: 0 % (ref 0.0–0.2)

## 2021-07-04 LAB — CBG MONITORING, ED: Glucose-Capillary: 203 mg/dL — ABNORMAL HIGH (ref 70–99)

## 2021-07-04 LAB — COMPREHENSIVE METABOLIC PANEL
ALT: 12 U/L (ref 0–44)
AST: 12 U/L — ABNORMAL LOW (ref 15–41)
Albumin: 4.2 g/dL (ref 3.5–5.0)
Alkaline Phosphatase: 64 U/L (ref 38–126)
Anion gap: 13 (ref 5–15)
BUN: 24 mg/dL — ABNORMAL HIGH (ref 8–23)
CO2: 24 mmol/L (ref 22–32)
Calcium: 10.1 mg/dL (ref 8.9–10.3)
Chloride: 102 mmol/L (ref 98–111)
Creatinine, Ser: 1.8 mg/dL — ABNORMAL HIGH (ref 0.44–1.00)
GFR, Estimated: 30 mL/min — ABNORMAL LOW (ref >60)
Glucose, Bld: 190 mg/dL — ABNORMAL HIGH (ref 70–99)
Potassium: 3.2 mmol/L — ABNORMAL LOW (ref 3.5–5.1)
Sodium: 139 mmol/L (ref 135–145)
Total Bilirubin: 0.8 mg/dL (ref 0.3–1.2)
Total Protein: 7.7 g/dL (ref 6.5–8.1)

## 2021-07-04 MED ORDER — SODIUM CHLORIDE 0.9 % IV BOLUS
1000.0000 mL | Freq: Once | INTRAVENOUS | Status: AC
  Administered 2021-07-04: 1000 mL via INTRAVENOUS

## 2021-07-04 MED ORDER — LISINOPRIL 40 MG PO TABS: 20.0000 mg | ORAL_TABLET | Freq: Every day | ORAL | 0 refills | Status: DC

## 2021-07-04 MED ORDER — HYDROCHLOROTHIAZIDE 50 MG PO TABS: 25.0000 mg | ORAL_TABLET | Freq: Every day | ORAL | 0 refills | Status: DC

## 2021-07-04 NOTE — ED Notes (Signed)
Patient is resting comfortably, warm blankets given.

## 2021-07-04 NOTE — ED Triage Notes (Signed)
Pt reports high blood pressure on Wednesday, felt "bad" , MD adjusted meds, pt became very short of breath when started moving around . Pt has generally not felt well since Wednesday. Denies n/v/d/fever.

## 2021-07-04 NOTE — Discharge Instructions (Addendum)
I believe that you were feeling weak and dizzy because your blood pressure was too low today.  This may be due to the recent changes in your medications, since 2 of your blood pressure pills were increased.  I recommend the following plan to manage your blood pressure at home.  It is very important that you follow-up with either your primary care doctor or the cardiologist this week to discuss this plan.  It is also very important that you buy blood pressure cuff today (with a machine) from a pharmacy.  You should check your blood pressure at least twice a day, once in the morning and once at night, and write down your blood pressures in a diary.  Plan;  Morning: take 50 mg of metoprolol and 25 mg of hydrochlorothiazide (HCTZ) with breakfast Evening:  Take 50 mg of metoprolol and 20 mg of lisinopril with dinner  Check your blood pressure in the morning and at night before dinner.   If your blood pressure has a top number OVER 188 mmhg (systolic pressure), you can take an extra 20 mg tablet of lisinopril.  *  Keep drinking plenty of water to stay hydrated.  *  Bring these papers to your cardiologist office appointment, so they can review this plan.  They may want to make other changes moving forward.

## 2021-07-04 NOTE — ED Provider Notes (Signed)
70  yo female w/ hx of HTN, recent uptitration of BP meds, here with fatigue.  BP low on arrival 99/55.  Improved with fluids.  Patient cannot recall exact name or dosing of BP meds, but confirms they were increased recently.  Per PCP office note on 06/29/21 - BP changes as noted below  - Increase Hydrochlorothiazide from 25 mg daily to 50 mg daily. - Increase Lisinopril from 20 mg daily to 40 mg daily. - Restart Metoprolol Tartrate as prescribed. - Counseled on blood pressure goal of less than 130/80, low-sodium, DASH diet, medication compliance, 150 minutes of moderate intensity exercise per week as tolerated. Discussed medication compliance, adverse effects. - BMP to evaluate kidney function and electrolyte balance. - Follow-up in 1 week with clinical pharmacist for blood pressure check. Medications may be adjusted at that time if needed. - Referral to Cardiology for further evaluation and management.  - Basic Metabolic Panel - hydrochlorothiazide (HYDRODIURIL) 50 MG tablet; Take 1 tablet (50 mg total) by mouth daily.  Dispense: 90 tablet; Refill: 0 - lisinopril (ZESTRIL) 40 MG tablet; Take 1 tablet (40 mg total) by mouth daily.  Dispense: 90 tablet; Refill: 0 - metoprolol tartrate (LOPRESSOR) 50 MG tablet; Take 1 tablet (50 mg total) by mouth 2 (two) times daily.  Dispense: 180 tablet; Refill: 0 - Ambulatory referral to Cardiology  *  Patient's blood pressure remained stable and normal.  I had a long discussion with her about the titration plan, she will dictate in her discharge instructions.  She has a cardiology appointment this week, which would also be beneficial for managing her blood pressure.  She is very hesitant to scale back on her blood pressure medicine because she says it gets "really high and I get bad headaches".  I strongly encouraged her to buy blood pressure cuff today and to keep a daily diary of her pressures at home.  Titration plan was provided.  She is otherwise feeling  okay and ready for discharge   Wyvonnia Dusky, MD 07/04/21 1601

## 2021-07-04 NOTE — ED Notes (Signed)
Pt is aware we need a urine specimen.

## 2021-07-04 NOTE — ED Provider Notes (Signed)
[facility] EMER[facility] DEPT Provider Note   CSN: [id number] Arrival date & time: 07/04/21  1241     History Chief Complaint  Patient presents with   Shortness of Breath    Jessica [name] is a 70 y.o. female.   Shortness of Breath  This patient is a 70 year old female, she has a history of diabetes hypertension and a prior history of gastrointestinal bleeding.  She presents to the hospital today with a complaint of generalized weakness and feeling poorly.  She reports that she has been out of state in [place] and has been without her blood pressure medication for a couple of weeks.  Upon return she went to her family doctor, they doubled the doses of her medications, she has been taking them religiously and states that over the last 24 hours has felt general weakness.  She tried to get up because she was feeling better today and went to the mall but she was having significant weakness all over her body.  She denies that this is focal, denies slurred speech or changes in vision, denies fevers chills nausea vomiting or diarrhea.  She states that now that she is resting her symptoms are improving but she still feels generally weak.  She was noted to have a blood pressure of 99/55 on arrival.  Past Medical History:  Diagnosis Date   Diabetes mellitus without complication ([place])    GI bleed 09/08/2019   Hypertension     Patient Active Problem List   Diagnosis Date Noted   Cystocele with prolapse 01/30/2020   Dyslipidemia 12/17/2019   GBS bacteriuria 12/09/2019   Uncontrolled type 2 diabetes mellitus with hyperglycemia ([place]) 10/08/2019   Palpitations 10/08/2019   Essential hypertension 10/08/2019   Anemia due to GI blood loss 10/08/2019   Gastroesophageal reflux disease without esophagitis 10/08/2019   GI bleed 01/23/2019   Diabetes ([place]) 01/23/2019    Past Surgical History:  Procedure Laterality Date   BUNIONECTOMY Right    COLONOSCOPY N/A 09/11/2019   Procedure:  COLONOSCOPY;  Surgeon: Virgel Manifold, MD;  Location: [facility] ENDOSCOPY;  Service: Endoscopy;  Laterality: N/A;   UNILATERAL SALPINGECTOMY     due to ectopic pregnancy but she does not recall if left or right     OB History     Gravida  3   Para  1   Term  1   Preterm      AB  2   Living  1      SAB      IAB      Ectopic  2   Multiple      Live Births  1           Family History  Problem Relation Age of Onset   Colon cancer Mother    Colon polyps Mother     Social History   Tobacco Use   Smoking status: Never   Smokeless tobacco: Never  Vaping Use   Vaping Use: Never used  Substance Use Topics   Alcohol use: Not Currently   Drug use: Never    Home Medications Prior to Admission medications   Medication Sig Start Date End Date Taking? Authorizing Provider  Blood Glucose Monitoring Suppl (ONETOUCH VERIO) w/Device KIT Use to monitor blood sugar up to 3 times per day. DM ICD-10 E11.65 and I10 11/26/19   Fulp, Ander Gaster, MD  Continuous Blood Gluc Receiver (FREESTYLE LIBRE 2 READER) DEVI 1 Device by Does not apply route as directed. 06/29/21  Camillia Herter, NP  Continuous Blood Gluc Sensor (FREESTYLE LIBRE 2 SENSOR) MISC 1 Device by Does not apply route as directed. 06/29/21   Camillia Herter, NP  Dulaglutide (TRULICITY) 3 YT/0.1SW SOPN Inject 3 mg as directed once a week. 06/29/21 09/27/21  Camillia Herter, NP  glucose blood (PRECISION QID TEST) test strip Use as instructed to check blood sugars up to 3 times per day 11/26/19   [provider]  hydrALAZINE (APRESOLINE) 25 MG tablet Take 25 mg by mouth daily.    [provider]  hydrochlorothiazide (HYDRODIURIL) 50 MG tablet Take 1 tablet (50 mg total) by mouth daily. 06/29/21 09/27/21  Camillia Herter, NP  insulin glargine (LANTUS SOLOSTAR) 100 UNIT/ML Solostar Pen Inject 25 Units into the skin at bedtime. 06/29/21 10/27/21  Camillia Herter, NP  Insulin Pen Needle (PEN NEEDLES) 31G X 8 MM MISC  UAD 06/29/21   Camillia Herter, NP  Lancets Misc. MISC Use as directed to check BS three times daily 11/26/19   [provider]  lisinopril (ZESTRIL) 40 MG tablet Take 1 tablet (40 mg total) by mouth daily. 06/29/21 09/27/21  Camillia Herter, NP  metoprolol tartrate (LOPRESSOR) 50 MG tablet Take 1 tablet (50 mg total) by mouth 2 (two) times daily. 06/29/21 09/27/21  Camillia Herter, NP  mometasone (ELOCON) 0.1 % cream mometasone 0.1 % topical cream    [provider]  OneTouch Delica Lancets 10X MISC Use as directed to check BS three times daily 11/26/19   Fulp, Cammie, MD  polyethylene glycol powder (GLYCOLAX/MIRALAX) 17 GM/SCOOP powder Take 255 g by mouth daily. Drink 17g (1 scoop) dissolved in water per day. 03/27/21   Jaquita Folds, MD  tolterodine (DETROL LA) 4 MG 24 hr capsule Take 1 capsule (4 mg total) by mouth daily. 09/22/20   Woodroe Mode, MD    Allergies    Patient has no known allergies.  Review of Systems   Review of Systems  Respiratory:  Positive for shortness of breath.   All other systems reviewed and are negative.  Physical Exam Updated Vital Signs this patient is BP (!) 99/55 (BP Location: Right Arm)   Pulse 94   Temp 97.7 F (36.5 C)   Resp 20   Wt 76.7 kg   SpO2 98%   BMI 26.15 kg/m   Physical Exam Vitals and nursing note reviewed.  Constitutional:      General: She is not in acute distress.    Appearance: She is well-developed.  HENT:     Head: Normocephalic and atraumatic.     Mouth/Throat:     Pharynx: No oropharyngeal exudate.  Eyes:     General: No scleral icterus.       Right eye: No discharge.        Left eye: No discharge.     Conjunctiva/sclera: Conjunctivae normal.     Pupils: Pupils are equal, round, and reactive to light.  Neck:     Thyroid: No thyromegaly.     Vascular: No JVD.  Cardiovascular:     Rate and Rhythm: Normal rate and regular rhythm.     Heart sounds: Normal heart sounds. No murmur heard.   No  friction rub. No gallop.  Pulmonary:     Effort: Pulmonary effort is normal. No respiratory distress.     Breath sounds: Normal breath sounds. No wheezing or rales.  Abdominal:     General: Bowel sounds are normal. There is no  distension.     Palpations: Abdomen is soft. There is no mass.     Tenderness: There is no abdominal tenderness.  Musculoskeletal:        General: No tenderness. Normal range of motion.     Cervical back: Normal range of motion and neck supple.  Lymphadenopathy:     Cervical: No cervical adenopathy.  Skin:    General: Skin is warm and dry.     Findings: No erythema or rash.  Neurological:     Mental Status: She is alert.     Coordination: Coordination normal.     Comments: Speech is clear, cranial nerves III through XII are intact, memory is intact, strength is normal in all 4 extremities including grips, sensation is intact to light touch and pinprick in all 4 extremities. Coordination as tested by finger-nose-finger is normal, no limb ataxia. Normal gait, normal reflexes at the patellar tendons bilaterally  Psychiatric:        Behavior: Behavior normal.    ED Results / Procedures / Treatments   Labs (all labs ordered are listed, but only abnormal results are displayed) Labs Reviewed - No data to display  EKG None  Radiology No results found.  Procedures Procedures   Medications Ordered in ED Medications - No data to display  ED Course  I have reviewed the triage vital signs and the nursing notes.  Pertinent labs & imaging results that were available during my care of the patient were reviewed by me and considered in my medical decision making (see chart for details).    MDM Rules/Calculators/A&P                          The patient's medications include Trulicity, Lantus, hydrochlorothiazide, lisinopril, metoprolol, she also takes or at least has a history of taking hydralazine.  She does not know the names of her medicines offhand.  Given  her slightly low blood pressure I will give her some fluids while we check some labs to make sure there is no renal function or significant problems with her blood sugar.  The patient is agreeable but otherwise well-appearing and nonfocal on her exam  At change of shift this patient's care will be signed out to oncoming physician Dr. Langston Masker to follow-up results and disposition accordingly  Final Clinical Impression(s) / ED Diagnoses Final diagnoses:  None    Rx / DC Orders ED Discharge Orders     None        Noemi Chapel, MD 07/04/21 1520

## 2021-07-06 ENCOUNTER — Ambulatory Visit: Payer: Medicare Other | Admitting: Pharmacist

## 2021-07-08 ENCOUNTER — Ambulatory Visit: Payer: Medicare Other | Admitting: Internal Medicine

## 2021-07-27 ENCOUNTER — Other Ambulatory Visit: Payer: Self-pay

## 2021-07-27 ENCOUNTER — Encounter: Payer: Self-pay | Admitting: Pharmacist

## 2021-07-27 ENCOUNTER — Ambulatory Visit: Payer: Medicare Other | Attending: Family | Admitting: Pharmacist

## 2021-07-27 VITALS — BP 133/88

## 2021-07-27 DIAGNOSIS — I10 Essential (primary) hypertension: Secondary | ICD-10-CM

## 2021-07-27 NOTE — Progress Notes (Signed)
   S:    PCP: Durene Fruits   Patient arrives in good spirits. Presents to the clinic for hypertension evaluation, counseling, and management. Patient was referred and last seen by Primary Care Provider on 06/29/2021. HCTZ and lisinopril dose increased at that visit. Pt presented to the ED on 07/04/2021 with hypotension. Pt's HCTZ and lisinopril doses were both decreased.  Medication adherence reported. Took all medications this morning. Denies any chest pain/dyspnea/HA/blurred vision. Weakness has improved since ED visit.   Current BP Medications include:  HCTZ 25 mg daily (takes 1/2 of the 50 mg tablet), lisinopril '20mg'$  daily (takes 1/2 of the 40 mg tablet), metoprolol tartrate 50 mg BID  Dietary habits include: compliant with salt restriction. Denies excessive caffeine intake.  Exercise habits include: walks daily. No time/frequency given.  Family / Social history:  -Fhx: no pertinent positives listed  -Tobacco: never smoker  -Alcohol: none reported   O:  Vitals:   07/27/21 1534  BP: 133/88    Home BP readings: none  Last 3 Office BP readings: BP Readings from Last 3 Encounters:  07/27/21 133/88  07/04/21 118/74  06/29/21 (!) 176/106    BMET    Component Value Date/Time   NA 139 07/04/2021 1431   NA 141 02/11/2021 1439   K 3.2 (L) 07/04/2021 1431   CL 102 07/04/2021 1431   CO2 24 07/04/2021 1431   GLUCOSE 190 (H) 07/04/2021 1431   BUN 24 (H) 07/04/2021 1431   BUN 10 02/11/2021 1439   CREATININE 1.80 (H) 07/04/2021 1431   CALCIUM 10.1 07/04/2021 1431   GFRNONAA 30 (L) 07/04/2021 1431   GFRAA 64 02/11/2021 1439    Renal function: CrCl cannot be calculated (Patient's most recent lab result is older than the maximum 21 days allowed.).  Clinical ASCVD: No  The 10-year ASCVD risk score Mikey Bussing DC Jr., et al., 2013) is: 31.6%   Values used to calculate the score:     Age: 47 years     Sex: Female     Is Non-Hispanic African American: Yes     Diabetic: Yes      Tobacco smoker: No     Systolic Blood Pressure: [id number] mmHg     Is BP treated: Yes     HDL Cholesterol: 76 mg/dL     Total Cholesterol: 263 mg/dL  A/P: Hypertension longstanding currently close to goal on current medications. BP Goal = < 130/80 mmHg. Medication adherence reported.   -Continued current regimen.  -F/u labs ordered - will check BMP at follow-up in 3 weeks -Counseled on lifestyle modifications for blood pressure control including reduced dietary sodium, increased exercise, adequate sleep.  Results reviewed and written information provided.   Total time in face-to-face counseling 30 minutes.   F/U Clinic Visit in 3 weeks.   Benard Halsted, PharmD, [place], [place] [phone]

## 2021-08-06 NOTE — Progress Notes (Signed)
[facility]   Subjective:     Chief Complaint:  No chief complaint on file.  History of Present Illness: Jessica [name] is a 70 y.o. female with stage III pelvic organ prolapse, stress incontinence and OAB who presents for a pessary check. She is using a size 3- 1/8in incontinence dish with support pessary. The pessary has stayed in place and has caused no issues. She denies leakage of urine or vaginal bleeding.    Past Medical History: Patient  has a past medical history of Diabetes mellitus without complication ([place]), GI bleed (09/08/2019), and Hypertension.   Past Surgical History: She  has a past surgical history that includes Colonoscopy (N/A, 09/11/2019); Bunionectomy (Right); and Unilateral salpingectomy.   Medications: She has a current medication list which includes the following prescription(s): onetouch verio, freestyle libre 2 reader, freestyle libre 2 sensor, trulicity, precision qid test, hydrochlorothiazide, lantus solostar, pen needles, lancets misc., lisinopril, metoprolol tartrate, mometasone, onetouch delica lancets 0000000, polyethylene glycol powder, and tolterodine.   Allergies: Patient has No Known Allergies.   Social History: Patient  reports that she has never smoked. She has never used smokeless tobacco. She reports that she does not currently use alcohol. She reports that she does not use drugs.     Objective:    Physical Exam: There were no vitals taken for this visit. Gen: No apparent distress, A&O x 3.  Detailed Urogynecologic Evaluation:  Pelvic Exam: Normal external female genitalia; Bartholin's and Skene's glands normal in appearance; urethral meatus normal in appearance, no urethral masses or discharge.  The pessary was removed without difficulty. Speculum exam was performed and showed normal mucosa without lesions. The pessary was cleaned and replaced.   Prior POP-Q (10/14/20):   POP-Q   3                                             Aa   3                                           Ba   -5                                              C    3.5                                            Gh   3.5                                            Pb   9                                            tvl    -2.5  Ap   -2.5                                            Bp   -7                                              D        Assessment/Plan:    Assessment: Jessica [name] is a 69 y.o. with stage III pelvic organ prolapse, stress incontinence and OAB.Marland Kitchen    Plan:  POP/ SUI - Will keep 3-1/8 in incontinence dish in place.  - Return 3 months for pessary check or sooner if needed.    Time Spent: Time spent: I spent 15 minutes dedicated to the care of this patient on the date of this encounter to include pre-visit review of records, face-to-face time with the patient and post visit documentation.

## 2021-08-11 ENCOUNTER — Other Ambulatory Visit: Payer: Self-pay

## 2021-08-11 ENCOUNTER — Ambulatory Visit (INDEPENDENT_AMBULATORY_CARE_PROVIDER_SITE_OTHER): Payer: Medicare Other | Admitting: Obstetrics and Gynecology

## 2021-08-11 ENCOUNTER — Encounter: Payer: Self-pay | Admitting: Obstetrics and Gynecology

## 2021-08-11 VITALS — BP 123/85 | HR 96

## 2021-08-11 DIAGNOSIS — N393 Stress incontinence (female) (male): Secondary | ICD-10-CM

## 2021-08-11 DIAGNOSIS — N812 Incomplete uterovaginal prolapse: Secondary | ICD-10-CM

## 2021-08-11 DIAGNOSIS — N8110 Cystocele, unspecified: Secondary | ICD-10-CM | POA: Diagnosis not present

## 2021-08-17 ENCOUNTER — Encounter: Payer: Medicare Other | Attending: Family | Admitting: Registered"

## 2021-08-17 ENCOUNTER — Other Ambulatory Visit: Payer: Self-pay

## 2021-08-17 ENCOUNTER — Encounter: Payer: Self-pay | Admitting: Registered"

## 2021-08-17 DIAGNOSIS — E1165 Type 2 diabetes mellitus with hyperglycemia: Secondary | ICD-10-CM | POA: Diagnosis present

## 2021-08-17 NOTE — Patient Instructions (Signed)
When you move back to [place], get established with a PCP right away and get your medications switched over so you will not have a lapse in your diabetes care. Continue to take medications as prescribed. Consider reading labels for total carbohydrates 15-40 grams per meal/ 0-15 per snack. Use handouts to help you identify carbs and appropriate serving sizes Eat balanced meals and snacks. Continue with your plan to use your Silver Sneakers benefits and exercise 3-5 times per week

## 2021-08-17 NOTE — Progress Notes (Signed)
Diabetes Self-Management Education  Visit Type: First/Initial  Appt. Start Time: 1520 Appt. End Time: [id number]  08/17/2021  [name], identified by name and date of birth, is a 70 y.o. female with a diagnosis of Diabetes: Type 2.   ASSESSMENT  There were no vitals taken for this visit. There is no height or weight on file to calculate BMI.  A1c 13.2% 06/29/21; PCP has set goal at <8%. Next A1c test Oct 2022 Medications: Trulicity 3 mg, lantus 25 units qhs From MD notes: Pt states metformin made her sick  Pt states she wants to lose weight, lower blood pressure and make diabetes go away and reduce or eliminate medications. Pt states she did not take any medication for about 6 months when she was fixing up her house in [place] preparing to sell it. Pt states her [place] medications could not be filled in [place].   Pt states she moved to [place] from [place] 3 yrs ago to be in warmer weather. Pt states her son and grandchildren are in [place] and plans to move back to [place] in a couple of weeks.   Pt states she goes to [employer] and makes a salad from their salad bar and will eat on it all day. Pt states she has changed her eating habits after her last MD appointment.  Pt states all her health problems started when she got shingles 6 yrs ago.   Diabetes Self-Management Education - 08/17/21 1541       Visit Information   Visit Type First/Initial      Initial Visit   Diabetes Type Type 2    Are you currently following a meal plan? No    Are you taking your medications as prescribed? Yes    Date Diagnosed 6 yrs ago      Health Coping   How would you rate your overall health? Good      Psychosocial Assessment   Patient Belief/Attitude about Diabetes Motivated to manage diabetes   motivated to get rid of it   How often do you need to have someone help you when you read instructions, pamphlets, or other written materials from your doctor or pharmacy? 1 - Never    What is the last grade level you completed in  school? bachelors      Complications   Last HgB A1C per patient/outside source 13.2 %    How often do you check your blood sugar? 1-2 times/day    Fasting Blood glucose range (mg/dL) 70-129   125   Have you had a dilated eye exam in the past 12 months? Yes    Have you had a dental exam in the past 12 months? Yes    Are you checking your feet? No      Dietary Intake   Breakfast burger [employer] sausage crescent OR salad    Lunch [employer] salad, berries, [place]    Dinner salad, asparagus, avocado, scalops    Snack (evening) 2 servings no-sugar added apple sauce OR chips/pretzels no salt OR 1-2 candies    Beverage(s) water, milk, lipton unsweet tea w splenda, soda 1x/week      Exercise   Exercise Type ADL's      Patient Education   Previous Diabetes Education No    Nutrition management  Role of diet in the treatment of diabetes and the relationship between the three main macronutrients and blood glucose level;Food label reading, portion sizes and measuring food.;Carbohydrate counting    Physical  activity and exercise  Role of exercise on diabetes management, blood pressure control and cardiac health.      Individualized Goals (developed by patient)   Nutrition General guidelines for healthy choices and portions discussed    Physical Activity Exercise 3-5 times per week      Outcomes   Expected Outcomes Demonstrated interest in learning. Expect positive outcomes    Future DMSE PRN    Program Status Completed             Individualized Plan for Diabetes Self-Management Training:   Learning Objective:  Patient will have a greater understanding of diabetes self-management. Patient education plan is to attend individual and/or group sessions per assessed needs and concerns.   Patient Instructions  When you move back to [place], get established with a PCP right away and get your medications switched over so you will not have a lapse in your diabetes care. Continue to take medications  as prescribed. Consider reading labels for total carbohydrates 15-40 grams per meal/ 0-15 per snack. Use handouts to help you identify carbs and appropriate serving sizes Eat balanced meals and snacks. Continue with your plan to use your Silver Sneakers benefits and exercise 3-5 times per week  Expected Outcomes:  Demonstrated interest in learning. Expect positive outcomes  Education material provided: ADA - How to Thrive: A Guide for Your Journey with Diabetes (copies), Food label handouts, and Carbohydrate counting sheet  If problems or questions, patient to contact team via:  Phone and MyChart  Future DSME appointment: PRN

## 2021-08-18 NOTE — Progress Notes (Signed)
S:    PCP: Durene Fruits   Patient arrives in good spirits. Presents to the clinic for hypertension evaluation, counseling, and management. Patient was referred and last seen by Primary Care Provider on 06/29/2021. HCTZ and lisinopril dose increased at that visit. Pt presented to the ED on 07/04/2021 with hypotension. Pt's HCTZ and lisinopril doses were both decreased. At last CPP visit on 8/8, BP was 133/88, weakness had improved since decreasing medications. Current medications continued with plan to check bmet at next visit.   Today, patient arrives in good spirits. Reports some dizziness last night "from being in the dark because of snakes" at her house. Denies weakness, headache, blurred vision, swelling. States she has been getting stronger and stronger. She was to continue taking lisinopril 20 mg (1/2 of 40 mg tablet) and HCTZ 25 mg (1/2 of 50 mg tablet), however patient thought she was to resume taking 1 tablet of each after her last visit. These had been previously decreased due to hypotension and weakness, but she states since resuming the full doses she has had no issues with this. Also only taking metoprolol tartrate 50 mg once daily rather than twice daily as prescribed.    Current BP Medications include:  HCTZ 25 mg daily (taking 50 mg), lisinopril '20mg'$  daily (taking 40 mg), metoprolol tartrate 50 mg BID (taking daily)  Dietary habits include: compliant with salt restriction. Denies excessive caffeine intake.  Exercise habits include: walks daily. No time/frequency given.  Family / Social history:  -Fhx: no pertinent positives listed  -Tobacco: never smoker  -Alcohol: none reported  O:  Vitals:   08/19/21 1516  BP: 138/86  Pulse: 81    Home BP readings: None provided (no cuff at home)  Last 3 Office BP readings: BP Readings from Last 3 Encounters:  08/19/21 138/86  08/11/21 123/85  07/27/21 133/88    BMET    Component Value Date/Time   NA 139 07/04/2021 1431   NA  141 02/11/2021 1439   K 3.2 (L) 07/04/2021 1431   CL 102 07/04/2021 1431   CO2 24 07/04/2021 1431   GLUCOSE 190 (H) 07/04/2021 1431   BUN 24 (H) 07/04/2021 1431   BUN 10 02/11/2021 1439   CREATININE 1.80 (H) 07/04/2021 1431   CALCIUM 10.1 07/04/2021 1431   GFRNONAA 30 (L) 07/04/2021 1431   GFRAA 64 02/11/2021 1439    Renal function: CrCl cannot be calculated (Patient's most recent lab result is older than the maximum 21 days allowed.).  Clinical ASCVD: No  The 10-year ASCVD risk score Mikey Bussing DC Jr., et al., 2013) is: 33.7%   Values used to calculate the score:     Age: 70 years     Sex: Female     Is Non-Hispanic African American: Yes     Diabetic: Yes     Tobacco smoker: No     Systolic Blood Pressure: 0000000 mmHg     Is BP treated: Yes     HDL Cholesterol: 76 mg/dL     Total Cholesterol: 263 mg/dL  A/P: Hypertension longstanding currently close to goal on current medications though she has been taking 1 whole tablet rather than 1/2 tablet of lisinopril and HCTZ. BP Goal = < 130/80 mmHg. Also taking metoprolol tartrate 50 mg only once per day rather than BID as prescribed. Will switch to equivalent dose of 24h formulation, metoprolol succinate 50 mg daily. Will also decrease HCTZ back to 25 mg daily as 50 mg dose has no additional  BP benefit but increased risk of electrolyte abnormalities.  -Continued lisinopril 40 mg daily -Decreased HCTZ to 25 mg daily -Switched to metoprolol succinate 50 mg daily -CMET today to assess renal function and electrolytes since patient has been taking increased doses of lisinopril and HCTZ -Counseled on lifestyle modifications for blood pressure control including reduced dietary sodium, increased exercise, adequate sleep.  Results reviewed and written information provided. Total time in face-to-face counseling 20 minutes. F/U PCP Clinic Visit on 10/3 with Amy.   Rebbeca Paul, PharmD PGY2 Ambulatory Care Pharmacy Resident 08/19/2021 3:28 PM

## 2021-08-19 ENCOUNTER — Other Ambulatory Visit: Payer: Self-pay

## 2021-08-19 ENCOUNTER — Ambulatory Visit: Payer: Medicare Other | Attending: Family | Admitting: Pharmacist

## 2021-08-19 DIAGNOSIS — I10 Essential (primary) hypertension: Secondary | ICD-10-CM

## 2021-08-19 MED ORDER — METOPROLOL SUCCINATE ER 50 MG PO TB24: 50.0000 mg | ORAL_TABLET | Freq: Every day | ORAL | 2 refills | Status: DC

## 2021-08-19 MED ORDER — HYDROCHLOROTHIAZIDE 25 MG PO TABS: 25.0000 mg | ORAL_TABLET | Freq: Every day | ORAL | 3 refills | Status: DC

## 2021-08-19 MED ORDER — LISINOPRIL 40 MG PO TABS: 40.0000 mg | ORAL_TABLET | Freq: Every day | ORAL | 2 refills | Status: DC

## 2021-08-20 LAB — CMP14+EGFR
ALT: 14 IU/L (ref 0–32)
AST: 16 IU/L (ref 0–40)
Albumin/Globulin Ratio: 1.6 (ref 1.2–2.2)
Albumin: 4.1 g/dL (ref 3.8–4.8)
Alkaline Phosphatase: 78 IU/L (ref 44–121)
BUN/Creatinine Ratio: 9 — ABNORMAL LOW (ref 12–28)
BUN: 8 mg/dL (ref 8–27)
Bilirubin Total: 0.3 mg/dL (ref 0.0–1.2)
CO2: 22 mmol/L (ref 20–29)
Calcium: 9.3 mg/dL (ref 8.7–10.3)
Chloride: 107 mmol/L — ABNORMAL HIGH (ref 96–106)
Creatinine, Ser: 0.92 mg/dL (ref 0.57–1.00)
Globulin, Total: 2.6 g/dL (ref 1.5–4.5)
Glucose: 129 mg/dL — ABNORMAL HIGH (ref 65–99)
Potassium: 4 mmol/L (ref 3.5–5.2)
Sodium: 142 mmol/L (ref 134–144)
Total Protein: 6.7 g/dL (ref 6.0–8.5)
eGFR: 68 mL/min/{1.73_m2} (ref >59)

## 2021-08-21 ENCOUNTER — Encounter: Payer: Self-pay | Admitting: Internal Medicine

## 2021-08-21 ENCOUNTER — Other Ambulatory Visit: Payer: Self-pay

## 2021-08-21 ENCOUNTER — Ambulatory Visit (INDEPENDENT_AMBULATORY_CARE_PROVIDER_SITE_OTHER): Payer: Medicare Other | Admitting: Internal Medicine

## 2021-08-21 VITALS — BP 140/92 | HR 80 | Ht 67.4 in | Wt 173.6 lb

## 2021-08-21 DIAGNOSIS — E1165 Type 2 diabetes mellitus with hyperglycemia: Secondary | ICD-10-CM

## 2021-08-21 DIAGNOSIS — R739 Hyperglycemia, unspecified: Secondary | ICD-10-CM

## 2021-08-21 LAB — GLUCOSE, POCT (MANUAL RESULT ENTRY): POC Glucose: 140 mg/dl — AB (ref 70–99)

## 2021-08-21 MED ORDER — LANTUS SOLOSTAR 100 UNIT/ML ~~LOC~~ SOPN: 25.0000 [IU] | PEN_INJECTOR | Freq: Every day | SUBCUTANEOUS | 3 refills | Status: DC

## 2021-08-21 MED ORDER — TRULICITY 3 MG/0.5ML ~~LOC~~ SOAJ: 3.0000 mg | SUBCUTANEOUS | 3 refills | Status: DC

## 2021-08-21 MED ORDER — INSULIN PEN NEEDLE 32G X 4 MM MISC: 1.0000 | Freq: Every day | 3 refills | Status: DC

## 2021-08-21 MED ORDER — DEXCOM G6 SENSOR MISC: 1.0000 | 3 refills | Status: DC

## 2021-08-21 MED ORDER — DEXCOM G6 TRANSMITTER MISC: 1.0000 | 3 refills | Status: DC

## 2021-08-21 MED ORDER — ONETOUCH VERIO VI STRP: 1.0000 | ORAL_STRIP | Freq: Two times a day (BID) | 3 refills | Status: DC

## 2021-08-21 NOTE — Patient Instructions (Signed)
-   Continue Lantus 25 units daily  - Continue Trulicity 3 mg once  weekly        HOW TO TREAT LOW BLOOD SUGARS (Blood sugar LESS THAN 70 MG/DL) Please follow the RULE OF 15 for the treatment of hypoglycemia treatment (when your (blood sugars are less than 70 mg/dL)   STEP 1: Take 15 grams of carbohydrates when your blood sugar is low, which includes:  3-4 GLUCOSE TABS  OR 3-4 OZ OF JUICE OR REGULAR SODA OR ONE TUBE OF GLUCOSE GEL    STEP 2: RECHECK blood sugar in 15 MINUTES STEP 3: If your blood sugar is still low at the 15 minute recheck --> then, go back to STEP 1 and treat AGAIN with another 15 grams of carbohydrates.

## 2021-08-21 NOTE — Progress Notes (Signed)
Name: Jessica [name]  Age/ Sex: 70 y.o., female   MRN/ DOB: [id number], 1951-10-14     PCP: Camillia Herter, NP   Reason for Endocrinology Evaluation: Type 2 Diabetes Mellitus  Initial Endocrine Consultative Visit: 12/17/2019    PATIENT IDENTIFIER: Jessica [name] is a 70 y.o. female with a past medical history of T2DM, HTN and Dyslipidemia . The patient has followed with Endocrinology clinic since 12/17/2019 for consultative assistance with management of her diabetes.  DIABETIC HISTORY:  Jessica [name] was diagnosed with T2DM  In her 41's. She is intolerant to Metformin. Her hemoglobin A1c has ranged from 7.0 % in 2019, peaking at 11.6% in 2020  On her initial visit to our clinic she had    SUBJECTIVE:   During the last visit (04/09/2020): A1c 8.1 % . We increased lantus and continued trulicity  . She was prescribed libre   Today (08/21/2021): Jessica [name] is here for a follow up on diabetes management. She has NOT been to our Clinic in 17 months. She checks her blood sugars 1 times daily. The patient has not had hypoglycemic episodes since the last clinic visit.  She has been in [place] for the past 6 months   She received training on the freestyle libre 05/2020    HOME DIABETES REGIMEN:  Trulicity 3  mg weekly  Lantus 20 units daily -25 units       METER DOWNLOAD SUMMARY: did not bring      DIABETIC COMPLICATIONS: Microvascular complications:    Denies: CKD, retinopathy , neuropathy  Last eye exam: Completed 05/2021   Macrovascular complications:    Denies: CAD, PVD, CVA   HISTORY:  Past Medical History:  Past Medical History:  Diagnosis Date   Diabetes mellitus without complication ([place])    GI bleed 09/08/2019   Hypertension    Past Surgical History:  Past Surgical History:  Procedure Laterality Date   BUNIONECTOMY Right    COLONOSCOPY N/A 09/11/2019   Procedure: COLONOSCOPY;  Surgeon: Virgel Manifold, MD;  Location: [facility] ENDOSCOPY;  Service: Endoscopy;   Laterality: N/A;   UNILATERAL SALPINGECTOMY     due to ectopic pregnancy but she does not recall if left or right   Social History:  reports that she has never smoked. She has never used smokeless tobacco. She reports that she does not currently use alcohol. She reports that she does not use drugs. Family History:  Family History  Problem Relation Age of Onset   Colon cancer Mother    Colon polyps Mother      HOME MEDICATIONS: Allergies as of 08/21/2021   No Known Allergies      Medication List        Accurate as of August 21, 2021 10:05 AM. If you have any questions, ask your nurse or doctor.          FreeStyle Libre 2 Reader Devi 1 Device by Does not apply route as directed.   FreeStyle Libre 2 Sensor Misc 1 Device by Does not apply route as directed.   hydrochlorothiazide 25 MG tablet Commonly known as: HYDRODIURIL Take 1 tablet (25 mg total) by mouth daily.   Lancets Misc. Misc Use as directed to check BS three times daily   Lantus SoloStar 100 UNIT/ML Solostar Pen Generic drug: insulin glargine Inject 25 Units into the skin at bedtime.   lisinopril 40 MG tablet Commonly known as: ZESTRIL Take 1 tablet (40 mg total) by mouth daily.   metoprolol succinate 50  MG 24 hr tablet Commonly known as: TOPROL-XL Take 1 tablet (50 mg total) by mouth daily. Take with or immediately following a meal.   mometasone 0.1 % cream Commonly known as: ELOCON mometasone 0.1 % topical cream   OneTouch Delica Lancets 36O Misc Use as directed to check BS three times daily   OneTouch Verio w/Device Kit Use to monitor blood sugar up to 3 times per day. DM ICD-10 E11.65 and I10   Pen Needles 31G X 8 MM Misc UAD   polyethylene glycol powder 17 GM/SCOOP powder Commonly known as: GLYCOLAX/MIRALAX Take 255 g by mouth daily. Drink 17g (1 scoop) dissolved in water per day.   Precision QID Test test strip Generic drug: glucose blood Use as instructed to check blood sugars up  to 3 times per day   tolterodine 4 MG 24 hr capsule Commonly known as: Detrol LA Take 1 capsule (4 mg total) by mouth daily.   Trulicity 3 QH/4.7ML Sopn Generic drug: Dulaglutide Inject 3 mg as directed once a week.         OBJECTIVE:   Vital Signs:BP (!) 140/92 (BP Location: Left Arm, Patient Position: Sitting, Cuff Size: Small)   Pulse 80   Ht 5' 7.4" (1.712 m)   Wt 173 lb 9.6 oz (78.7 kg)   SpO2 98%   BMI 26.87 kg/m   Wt Readings from Last 3 Encounters:  07/04/21 169 lb (76.7 kg)  06/29/21 171 lb 12.8 oz (77.9 kg)  03/27/21 175 lb (79.4 kg)     Exam: General: Pt appears well and is in NAD  Lungs: Clear with good BS bilat with no rales, rhonchi, or wheezes  Heart: RRR with normal S1 and S2 and no gallops; no murmurs; no rub  Abdomen: Normoactive bowel sounds, soft, nontender, without masses or organomegaly palpable  Extremities: No pretibial edema. No tremor. Normal strength and motion throughout. See detailed diabetic foot exam below.  Neuro: MS is good with appropriate affect, pt is alert and Ox3      DM foot exam: 12/17/2019   The skin of the feet is intact without sores or ulcerations. The pedal pulses are 2+ on right and 2+ on left. The sensation is intact to a screening 5.07, 10 gram monofilament bilaterally    DATA REVIEWED:  Lab Results  Component Value Date   HGBA1C 13.2 (A) 06/29/2021   HGBA1C 8.1 (A) 04/09/2020   HGBA1C 11.6 (A) 12/17/2019   Lab Results  Component Value Date   LDLCALC 160 (H) 10/08/2019   CREATININE 0.92 08/19/2021     Lab Results  Component Value Date   CHOL 263 (H) 10/08/2019   HDL 76 10/08/2019   LDLCALC 160 (H) 10/08/2019   TRIG 155 (H) 10/08/2019   CHOLHDL 3.5 10/08/2019       Results for EDNA, REDE (MRN [id number]) as of 08/25/2021 10:19  Ref. Range 08/19/2021 15:27  Sodium Latest Ref Range: 134 - 144 mmol/L 142  Potassium Latest Ref Range: 3.5 - 5.2 mmol/L 4.0  Chloride Latest Ref Range: 96 - 106 mmol/L 107  (H)  CO2 Latest Ref Range: 20 - 29 mmol/L 22  Glucose Latest Ref Range: 65 - 99 mg/dL 129 (H)  BUN Latest Ref Range: 8 - 27 mg/dL 8  Creatinine Latest Ref Range: 0.57 - 1.00 mg/dL 0.92  Calcium Latest Ref Range: 8.7 - 10.3 mg/dL 9.3  BUN/Creatinine Ratio Latest Ref Range: 12 - 28  9 (L)  eGFR Latest Ref Range: >59 mL/min/1.73 68  Alkaline Phosphatase Latest Ref Range: 44 - 121 IU/L 78  Albumin Latest Ref Range: 3.8 - 4.8 g/dL 4.1  Albumin/Globulin Ratio Latest Ref Range: 1.2 - 2.2  1.6  AST Latest Ref Range: 0 - 40 IU/L 16  ALT Latest Ref Range: 0 - 32 IU/L 14  Total Protein Latest Ref Range: 6.0 - 8.5 g/dL 6.7  Total Bilirubin Latest Ref Range: 0.0 - 1.2 mg/dL 0.3  Globulin, Total Latest Ref Range: 1.5 - 4.5 g/dL 2.6    ASSESSMENT / PLAN / RECOMMENDATIONS:   1) Type 2 Diabetes Mellitus, with improved glycemic control, Without complications - Most recent A1c of 13.2 %. Goal A1c < 7.0 %.     - Poorly controlled diabetes due to medication non-adherence , she ran out of meds while in [place] and was unable to fill them. She was recently restarted on her medications with an increase in Lantus dose, her in-office Bg was 140 mg/dL  - No changes will be made at this time  - She is intolerant to 1 tablet of Metformin  - She is interested in CGM, prescription sent    MEDICATIONS:   Continue Trulicity at 1.5 mg weekly  Continue  Lantus to 25 units daily    EDUCATION / INSTRUCTIONS: BG monitoring instructions: Patient is instructed to check her blood sugars 2 times a day, fasting and bedtime. Call [facility] if: BG persistently < 70 or > 300. I reviewed the Rule of 15 for the treatment of hypoglycemia in detail with the patient. Literature supplied.     2) Diabetic complications:  Eye: Does not have known diabetic retinopathy.  Neuro/ Feet: Does not have known diabetic peripheral neuropathy. Renal: Patient does not have known baseline CKD. She is on an ACEI/ARB at present.        F/U in 3 months      Signed electronically by: Mack Guise, MD  [facility]  [facility] [address] [place], [place] [address] Phone: [phone] FAX: [phone]   CC: Camillia Herter, NP [place] [address] Phone: [phone]  Fax: [phone]  Return to Endocrinology clinic as below: Future Appointments  Date Time Provider [place]  08/21/2021 10:10 AM Linde Wilensky, Melanie Crazier, MD [facility] None  09/21/2021  2:40 PM Camillia Herter, NP [facility] None  10/30/2021  8:20 AM Donato Heinz, MD [facility]  11/18/2021  9:20 AM Jaquita Folds, MD [facility]  02/16/2022  9:20 AM Jaquita Folds, MD [facility]

## 2021-08-31 ENCOUNTER — Telehealth: Payer: Self-pay

## 2021-08-31 MED ORDER — ONETOUCH VERIO VI STRP: 1.0000 | ORAL_STRIP | Freq: Two times a day (BID) | 3 refills | Status: AC

## 2021-08-31 NOTE — Telephone Encounter (Signed)
New script sent with Dx code

## 2021-09-02 ENCOUNTER — Telehealth: Payer: Self-pay

## 2021-09-02 NOTE — Telephone Encounter (Signed)
Vm left for patient to contact [place] pharmacies so they can process Dexcom order.  Telephone:[phone] opt#1

## 2021-09-17 NOTE — Progress Notes (Deleted)
Patient ID: Jessica [name], female    DOB: [birth year]  MRN: [id number]  CC: No chief complaint on file.   Subjective: Jessica [name] is a 70 y.o. female who presents for Her concerns today include: ***  SHE WAS REFERRED OUT TO [facility] ON 06/29/2021  Patient Active Problem List   Diagnosis Date Noted   Cystocele with prolapse 01/30/2020   Dyslipidemia 12/17/2019   GBS bacteriuria 12/09/2019   Uncontrolled type 2 diabetes mellitus with hyperglycemia ([place]) 10/08/2019   Palpitations 10/08/2019   Essential hypertension 10/08/2019   Anemia due to GI blood loss 10/08/2019   Gastroesophageal reflux disease without esophagitis 10/08/2019   GI bleed 01/23/2019   Diabetes ([place]) 01/23/2019     Current Outpatient Medications on File Prior to Visit  Medication Sig Dispense Refill   Blood Glucose Monitoring Suppl (ONETOUCH VERIO) w/Device KIT Use to monitor blood sugar up to 3 times per day. DM ICD-10 E11.65 and I10 (Patient not taking: Reported on 08/21/2021) 1 kit 0   Continuous Blood Gluc Receiver (FREESTYLE LIBRE 2 READER) DEVI 1 Device by Does not apply route as directed. (Patient not taking: Reported on 08/21/2021) 1 each 0   Continuous Blood Gluc Sensor (DEXCOM G6 SENSOR) MISC 1 Device by Does not apply route as directed. 9 each 3   Continuous Blood Gluc Transmit (DEXCOM G6 TRANSMITTER) MISC 1 Device by Does not apply route as directed. 1 each 3   Dulaglutide (TRULICITY) 3 WU/9.8JX SOPN Inject 3 mg as directed once a week. 6 mL 3   glucose blood (ONETOUCH VERIO) test strip 1 each by Other route in the morning and at bedtime. Use as instructed 200 each 3   hydrochlorothiazide (HYDRODIURIL) 25 MG tablet Take 1 tablet (25 mg total) by mouth daily. 90 tablet 3   insulin glargine (LANTUS SOLOSTAR) 100 UNIT/ML Solostar Pen Inject 25 Units into the skin at bedtime. 30 mL 3   Insulin Pen Needle 32G X 4 MM MISC 1 Device by Does not apply route daily in the afternoon. 100 each 3   Lancets Misc.  MISC Use as directed to check BS three times daily     lisinopril (ZESTRIL) 40 MG tablet Take 1 tablet (40 mg total) by mouth daily. 30 tablet 2   metoprolol succinate (TOPROL-XL) 50 MG 24 hr tablet Take 1 tablet (50 mg total) by mouth daily. Take with or immediately following a meal. 30 tablet 2   mometasone (ELOCON) 0.1 % cream mometasone 0.1 % topical cream     OneTouch Delica Lancets 91Y MISC Use as directed to check BS three times daily 100 each 5   polyethylene glycol powder (GLYCOLAX/MIRALAX) 17 GM/SCOOP powder Take 255 g by mouth daily. Drink 17g (1 scoop) dissolved in water per day. 255 g 11   tolterodine (DETROL LA) 4 MG 24 hr capsule Take 1 capsule (4 mg total) by mouth daily. (Patient not taking: Reported on 08/21/2021) 30 capsule 1   No current facility-administered medications on file prior to visit.    No Known Allergies  Social History   Socioeconomic History   Marital status: Single    Spouse name: Not on file   Number of children: Not on file   Years of education: Not on file   Highest education level: Not on file  Occupational History   Not on file  Tobacco Use   Smoking status: Never   Smokeless tobacco: Never  Vaping Use   Vaping Use: Never used  Substance and Sexual Activity   Alcohol use: Not Currently   Drug use: Never   Sexual activity: Not Currently  Other Topics Concern   Not on file  Social History Narrative   Not on file   Social Determinants of Health   Financial Resource Strain: Not on file  Food Insecurity: Not on file  Transportation Needs: Not on file  Physical Activity: Not on file  Stress: Not on file  Social Connections: Not on file  Intimate Partner Violence: Not on file    Family History  Problem Relation Age of Onset   Colon cancer Mother    Colon polyps Mother     Past Surgical History:  Procedure Laterality Date   BUNIONECTOMY Right    COLONOSCOPY N/A 09/11/2019   Procedure: COLONOSCOPY;  Surgeon: Virgel Manifold,  MD;  Location: [facility] ENDOSCOPY;  Service: Endoscopy;  Laterality: N/A;   UNILATERAL SALPINGECTOMY     due to ectopic pregnancy but she does not recall if left or right    ROS: Review of Systems Negative except as stated above  PHYSICAL EXAM: There were no vitals taken for this visit.  Physical Exam  {female adult master:310786} {female adult master:310785}  CMP Latest Ref Rng & Units 08/19/2021 07/04/2021 02/11/2021  Glucose 65 - 99 mg/dL 129(H) 190(H) 235(H)  BUN 8 - 27 mg/dL 8 24(H) 10  Creatinine 0.57 - 1.00 mg/dL 0.92 1.80(H) 1.04(H)  Sodium 134 - 144 mmol/L 142 139 141  Potassium 3.5 - 5.2 mmol/L 4.0 3.2(L) 4.0  Chloride 96 - 106 mmol/L 107(H) 102 103  CO2 20 - 29 mmol/L _0 Calcium 8.7 - 10.3 mg/dL 9.3 10.1 10.3  Total Protein 6.0 - 8.5 g/dL 6.7 7.7 -  Total Bilirubin 0.0 - 1.2 mg/dL 0.3 0.8 -  Alkaline Phos 44 - 121 IU/L 78 64 -  AST 0 - 40 IU/L 16 12(L) -  ALT 0 - 32 IU/L 14 12 -   Lipid Panel     Component Value Date/Time   CHOL 263 (H) 10/08/2019 1331   TRIG 155 (H) 10/08/2019 1331   HDL 76 10/08/2019 1331   CHOLHDL 3.5 10/08/2019 1331   LDLCALC 160 (H) 10/08/2019 1331    CBC    Component Value Date/Time   WBC 6.0 07/04/2021 1431   RBC 5.06 07/04/2021 1431   HGB 13.9 07/04/2021 1431   HGB 11.4 10/08/2019 1331   HCT 42.5 07/04/2021 1431   HCT 35.0 10/08/2019 1331   PLT 285 07/04/2021 1431   PLT 269 10/08/2019 1331   MCV 84.0 07/04/2021 1431   MCV 85 10/08/2019 1331   MCH 27.5 07/04/2021 1431   MCHC 32.7 07/04/2021 1431   RDW 12.5 07/04/2021 1431   RDW 14.1 10/08/2019 1331   LYMPHSABS 1.9 07/04/2021 1431   LYMPHSABS 2.6 10/08/2019 1331   MONOABS 0.5 07/04/2021 1431   EOSABS 0.1 07/04/2021 1431   EOSABS 0.1 10/08/2019 1331   BASOSABS 0.0 07/04/2021 1431   BASOSABS 0.0 10/08/2019 1331    ASSESSMENT AND PLAN:  There are no diagnoses linked to this encounter.   Patient was given the opportunity to ask questions.  Patient verbalized  understanding of the plan and was able to repeat key elements of the plan. Patient was given clear instructions to go to Emergency Department or return to medical center if symptoms don't improve, worsen, or new problems develop.The patient verbalized understanding.   No orders of the defined types were placed in this encounter.  Requested Prescriptions    No prescriptions requested or ordered in this encounter    No follow-ups on file.  Camillia Herter, NP

## 2021-09-21 ENCOUNTER — Ambulatory Visit: Payer: Medicare Other | Admitting: Family

## 2021-09-30 NOTE — Telephone Encounter (Signed)
completed

## 2021-10-22 NOTE — Progress Notes (Signed)
Erroneous encounter

## 2021-10-23 ENCOUNTER — Encounter: Payer: Medicare Other | Admitting: Family

## 2021-10-23 DIAGNOSIS — I10 Essential (primary) hypertension: Secondary | ICD-10-CM

## 2021-10-23 DIAGNOSIS — E1165 Type 2 diabetes mellitus with hyperglycemia: Secondary | ICD-10-CM

## 2021-10-30 ENCOUNTER — Ambulatory Visit: Payer: Medicare Other | Admitting: Cardiology

## 2021-10-30 NOTE — Progress Notes (Deleted)
Cardiology Office Note:    Date:  10/30/2021   ID:  Jessica [name], DOB [birth year], MRN [id number]  PCP:  Camillia Herter, NP  Cardiologist:  None  Electrophysiologist:  None   Referring MD: Camillia Herter, NP   No chief complaint on file.    History of Present Illness:    Jessica [name] is a 70 y.o. female with a hx of type 2 diabetes, hypertension who presents for follow-up.  She was referred by Dr. Chapman Fitch for an evaluation of palpitations.  She was admitted to hospital  from 09/07/2022 through 09/12/2019 for GI bleed.  Hgb was down to 6.7 on presentation.  Colonoscopy showed diverticulosis.  She reports that she had an abnormal heart rhythm while admitted and was started on metoprolol.  States that heart was racing.  She states that since she got out of the hospital she has had more episodes where she feels like her heart is racing.  Occurs for 2 to 3 minutes and resolves.  She has a history of these episodes but had not had it for years.  Now occurring 1-2 times per week.  Also reports has been having worsening dyspnea on exertion.  States that she will walk 2 miles per day but if she tries to extend her walks she feels significant shortness of breath.  Denies any exertional chest pain.  Father died of MI at 14.  Sister had stents in early 44s.  Quit smoking 20 years ago.    TTE on 11/23/2019 showed moderate asymmetric basal septal hypertrophy, normal LV systolic function, moderate AI, normal RV function.  CMR on 03/04/2020 showed asymmetric septal hypertrophy measuring up to 15 mm consistent with HCM, patchy LGE in basal septum and RV insertion site (6% of total myocardial mass), normal LV/RV size and systolic function, trivial aortic regurgitation.  Cardiac monitor was ordered but has not been done.  Since last clinic visit,  she reports that she continues to have palpitations.  Denies any lightheadedness or chest pain.  She recently started weight watchers, has lost 2 pounds.  She is walking 2 miles  per day.  She reports some dyspnea on exertion, but denies any chest pain.   Past Medical History:  Diagnosis Date   Diabetes mellitus without complication ([place])    GI bleed 09/08/2019   Hypertension     Past Surgical History:  Procedure Laterality Date   BUNIONECTOMY Right    COLONOSCOPY N/A 09/11/2019   Procedure: COLONOSCOPY;  Surgeon: Virgel Manifold, MD;  Location: [facility] ENDOSCOPY;  Service: Endoscopy;  Laterality: N/A;   UNILATERAL SALPINGECTOMY     due to ectopic pregnancy but she does not recall if left or right    Current Medications: No outpatient medications have been marked as taking for the 10/30/21 encounter (Appointment) with Donato Heinz, MD.     Allergies:   Patient has no known allergies.   Social History   Socioeconomic History   Marital status: Single    Spouse name: Not on file   Number of children: Not on file   Years of education: Not on file   Highest education level: Not on file  Occupational History   Not on file  Tobacco Use   Smoking status: Never   Smokeless tobacco: Never  Vaping Use   Vaping Use: Never used  Substance and Sexual Activity   Alcohol use: Not Currently   Drug use: Never   Sexual activity: Not Currently  Other Topics Concern  Not on file  Social History Narrative   Not on file   Social Determinants of Health   Financial Resource Strain: Not on file  Food Insecurity: Not on file  Transportation Needs: Not on file  Physical Activity: Not on file  Stress: Not on file  Social Connections: Not on file  Former smoker  Family History: The patient's family history includes Colon cancer in her mother; Colon polyps in her mother.  ROS:   Please see the history of present illness.     All other systems reviewed and are negative.  EKGs/Labs/Other Studies Reviewed:    The following studies were reviewed today:   EKG:  EKG is.ordered today.  The ekg ordered most recently demonstrates normal sinus  rhythm, rate 79, no ST/T abnormalities  TTE 11/23/19:  1. Left ventricular ejection fraction, by visual estimation, is 60 to  65%. The left ventricle has normal function. There is moderately increased  left ventricular hypertrophy.   2. Asymmetric basal septal hypertrophy measuring up to 53mm (29mm in  posterior wall)   3. The average left ventricular global longitudinal strain is -18.3 %.   4. Left ventricular diastolic parameters are indeterminate.   5. Global right ventricle has normal systolic function.The right  ventricular size is normal. No increase in right ventricular wall  thickness.   6. Left atrial size was normal.   7. Right atrial size was normal.   8. The mitral valve is normal in structure. No evidence of mitral valve  regurgitation.   9. The tricuspid valve is normal in structure. Tricuspid valve  regurgitation is trivial.  10. The aortic valve is tricuspid. Aortic valve regurgitation is moderate.  No evidence of aortic valve sclerosis or stenosis.  11. The pulmonic valve was grossly normal. Pulmonic valve regurgitation is  mild.  12. The inferior vena cava is normal in size with greater than 50%  respiratory variability, suggesting right atrial pressure of 3 mmHg.   CMR 03/04/20: 1. Asymmetric hypertrophy measuring up to 44mm in basal septum (48mm in posterior wall), consistent with hypertrophic cardiomyopathy 2. Patchy LGE in basal septum and RV insertion site, consistent with HCM. LGE accounts for 6% of total myocardial mass 3.  Normal LV size and systolic function (EF 67%) 4.  Normal RV size and systolic function (EF 20%)  Recent Labs: 07/04/2021: Hemoglobin 13.9; Platelets 285 08/19/2021: ALT 14; BUN 8; Creatinine, Ser 0.92; Potassium 4.0; Sodium 142  Recent Lipid Panel    Component Value Date/Time   CHOL 263 (H) 10/08/2019 1331   TRIG 155 (H) 10/08/2019 1331   HDL 76 10/08/2019 1331   CHOLHDL 3.5 10/08/2019 1331   LDLCALC 160 (H) 10/08/2019 1331     Physical Exam:    VS:  There were no vitals taken for this visit.    Wt Readings from Last 3 Encounters:  08/21/21 173 lb 9.6 oz (78.7 kg)  07/04/21 169 lb (76.7 kg)  06/29/21 171 lb 12.8 oz (77.9 kg)     GEN:  Well nourished, well developed in no acute distress HEENT: Normal NECK: No JVD; No carotid bruits LYMPHATICS: No lymphadenopathy CARDIAC: RRR, no murmurs, rubs, gallops RESPIRATORY:  Clear to auscultation without rales, wheezing or rhonchi  ABDOMEN: Soft, non-tender, non-distended MUSCULOSKELETAL:  No edema; No deformity  SKIN: Warm and dry NEUROLOGIC:  Alert and oriented x 3 PSYCHIATRIC:  Normal affect   ASSESSMENT:    No diagnosis found.  PLAN:    Hypertrophic cardiomyopathy: CMR on 03/04/2020 showed asymmetric  septal hypertrophy measuring up to 15 mm consistent with HCM, patchy LGE in basal septum and RV insertion site (6% of total myocardial mass).  No LVOT obstruction on TTE.   -Repeat echo to evaluate for obstruction  -Zio patch was ordered but has not been done, will reorder  -Recommended first-degree family members be screened for HCM.  Palpitations: Description concerning for arrhythmia.  Reportedly had episode while admitted at [facility] and was started on metoprolol, but I cannot find documentation of this.  Zio patch x1 week pending  Hypertension: On lisinopril 40 mg daily, Toprol-XL 25 mg daily, HCTZ 25 mg daily  Hyperlipidemia: On rosuvastatin 20 mg daily.  Type 2 diabetes: A1c 13.2%, follows with endocrinology.  On insulin  RTC in ***  Medication Adjustments/Labs and Tests Ordered: Current medicines are reviewed at length with the patient today.  Concerns regarding medicines are outlined above.  No orders of the defined types were placed in this encounter.  No orders of the defined types were placed in this encounter.   There are no Patient Instructions on file for this visit.   Signed, Donato Heinz, MD  10/30/2021 6:28 AM     [facility]

## 2021-11-18 ENCOUNTER — Ambulatory Visit: Payer: Medicare Other | Admitting: Obstetrics and Gynecology

## 2021-11-18 NOTE — Progress Notes (Deleted)
[facility]   Subjective:     Chief Complaint:  No chief complaint on file.  History of Present Illness: Jessica [name] is a 70 y.o. female with stage III pelvic organ prolapse, stress incontinence and OAB who presents for a pessary check. She is using a size 3- 1/8in incontinence dish with support pessary. The pessary has stayed in place and has caused no issues. She denies leakage of urine or vaginal bleeding.    Past Medical History: Patient  has a past medical history of Diabetes mellitus without complication ([place]), GI bleed (09/08/2019), and Hypertension.   Past Surgical History: She  has a past surgical history that includes Colonoscopy (N/A, 09/11/2019); Bunionectomy (Right); and Unilateral salpingectomy.   Medications: She has a current medication list which includes the following prescription(s): onetouch verio, freestyle libre 2 reader, dexcom g6 sensor, dexcom g6 transmitter, trulicity, onetouch verio, hydrochlorothiazide, lantus solostar, insulin pen needle, lancets misc., lisinopril, metoprolol succinate, mometasone, onetouch delica lancets 16W, polyethylene glycol powder, and tolterodine.   Allergies: Patient has No Known Allergies.   Social History: Patient  reports that she has never smoked. She has never used smokeless tobacco. She reports that she does not currently use alcohol. She reports that she does not use drugs.     Objective:    Physical Exam: There were no vitals taken for this visit. Gen: No apparent distress, A&O x 3.  Detailed Urogynecologic Evaluation:  Pelvic Exam: Normal external female genitalia; Bartholin's and Skene's glands normal in appearance; urethral meatus normal in appearance, no urethral masses or discharge.  The pessary was removed without difficulty. Speculum exam was performed and showed normal mucosa without lesions. The pessary was cleaned and replaced.   Prior POP-Q (10/14/20):   POP-Q   3                                             Aa   3                                           Ba   -5                                              C    3.5                                            Gh   3.5                                            Pb   9                                            tvl    -2.5  Ap   -2.5                                            Bp   -7                                              D        Assessment/Plan:    Assessment: [name] is a 70 y.o. with stage III pelvic organ prolapse, stress incontinence and OAB.Marland Kitchen    Plan:  POP/ SUI - Will keep 3-1/8 in incontinence dish in place.  - Return 3 months for pessary check or sooner if needed.    Time Spent: Time spent: I spent 15 minutes dedicated to the care of this patient on the date of this encounter to include pre-visit review of records, face-to-face time with the patient and post visit documentation.

## 2021-11-19 ENCOUNTER — Ambulatory Visit (INDEPENDENT_AMBULATORY_CARE_PROVIDER_SITE_OTHER): Payer: Medicare Other | Admitting: Obstetrics and Gynecology

## 2021-11-19 ENCOUNTER — Other Ambulatory Visit: Payer: Self-pay

## 2021-11-19 ENCOUNTER — Encounter: Payer: Self-pay | Admitting: Obstetrics and Gynecology

## 2021-11-19 VITALS — BP 149/90 | HR 88 | Wt 171.0 lb

## 2021-11-19 DIAGNOSIS — N8110 Cystocele, unspecified: Secondary | ICD-10-CM

## 2021-11-19 DIAGNOSIS — N812 Incomplete uterovaginal prolapse: Secondary | ICD-10-CM | POA: Diagnosis not present

## 2021-11-19 NOTE — Progress Notes (Signed)
[facility]   Subjective:     Chief Complaint:  Chief Complaint  Patient presents with   Pessary Check    History of Present Illness: Jessica [name] is a 70 y.o. female with stage III pelvic organ prolapse, stress incontinence and OAB who presents for a pessary check. She is using a size 3- 1/8in incontinence dish with support pessary. Denies any issues with the pessary. Denies vaginal bleeding. Denies urinary leakage.   Past Medical History: Patient  has a past medical history of Diabetes mellitus without complication ([place]), GI bleed (09/08/2019), and Hypertension.   Past Surgical History: She  has a past surgical history that includes Colonoscopy (N/A, 09/11/2019); Bunionectomy (Right); and Unilateral salpingectomy.   Medications: She has a current medication list which includes the following prescription(s): dexcom g6 sensor, dexcom g6 transmitter, trulicity, onetouch verio, hydrochlorothiazide, lantus solostar, insulin pen needle, lancets misc., metoprolol succinate, mometasone, onetouch delica lancets 46K, polyethylene glycol powder, and lisinopril.   Allergies: Patient has No Known Allergies.   Social History: Patient  reports that she has never smoked. She has never used smokeless tobacco. She reports that she does not currently use alcohol. She reports that she does not use drugs.     Objective:    Physical Exam: BP (!) 149/90   Pulse 88   Wt 171 lb (77.6 kg)   BMI 26.47 kg/m  Gen: No apparent distress, A&O x 3.  Detailed Urogynecologic Evaluation:  Pelvic Exam: Normal external female genitalia; Bartholin's and Skene's glands normal in appearance; urethral meatus normal in appearance, no urethral masses or discharge.  The pessary was removed without difficulty. Speculum exam was performed and showed normal mucosa without lesions. The pessary was cleaned and replaced.   Prior POP-Q (10/14/20):   POP-Q   3                                            Aa    3                                           Ba   -5                                              C    3.5                                            Gh   3.5                                            Pb   9                                            tvl    -2.5  Ap   -2.5                                            Bp   -7                                              D        Assessment/Plan:    Assessment: Jessica [name] is a 70 y.o. with stage III pelvic organ prolapse, stress incontinence and OAB.Marland Kitchen    Plan:  POP/ SUI - Continue 3-1/8 in incontinence dish in place.  - Return 3 months for pessary check or sooner if needed.    Time Spent: Time spent: I spent 15 minutes dedicated to the care of this patient on the date of this encounter to include pre-visit review of records, face-to-face time with the patient and post visit documentation.

## 2021-11-25 ENCOUNTER — Ambulatory Visit: Payer: Medicare Other | Admitting: Internal Medicine

## 2022-01-18 ENCOUNTER — Ambulatory Visit: Payer: Medicare Other | Admitting: Internal Medicine

## 2022-01-18 ENCOUNTER — Other Ambulatory Visit: Payer: Self-pay

## 2022-01-18 DIAGNOSIS — E1165 Type 2 diabetes mellitus with hyperglycemia: Secondary | ICD-10-CM

## 2022-01-18 MED ORDER — TRULICITY 3 MG/0.5ML ~~LOC~~ SOAJ: 3.0000 mg | SUBCUTANEOUS | 0 refills | Status: DC

## 2022-02-06 NOTE — Progress Notes (Signed)
Patient ID: Jessica [name], female    DOB: [birth year]  MRN: [id number]  CC: Hypertension Follow-Up  Subjective: Jessica [name] is a 71 y.o. female who presents for hypertension follow-up.   Her concerns today include:  HYPERTENSION FOLLOW-UP: 06/29/2021: - Increase Hydrochlorothiazide from 25 mg daily to 50 mg daily.  - Increase Lisinopril from 20 mg daily to 40 mg daily.  - Restart Metoprolol Tartrate as prescribed.  - Follow-up in 1 week with clinical pharmacist for blood pressure check. Medications may be adjusted at that time if needed. - Referral to Cardiology for further evaluation and management.   02/09/2022: Doing well on current regimen, no issues/concerns. Has improved diet and practicing routine exercise. Denies shortness of breath. Recent intermittent chest pains (none present today in office). Family history of stroke and heart disease. Would like referral to Cardiology for additional screening.   2. DIABETES TYPE 2 FOLLOW-UP: 06/29/2021: - Hemoglobin A1c not at goal today at 13.2%, goal < 8%. This is increased from previous hemoglobin A1c of  8.1% on 04/09/2020. Next hemoglobin A1c due October 2022.  - Increase Dulaglutide from 1.5 mg weekly to 3 mg weekly.  - Increase Insulin Glargine from 20 units at bedtime to 25 units at bedtime.  - Patient reports she is intolerant to Metformin. She is also intolerant to another diabetes medication but she is unsure of the name.  - Referral to Medical Nutrition Therapy for further management.  - Referral to Endocrinology for further evaluation and management.   02/09/2022: Doing well on current regimen. Unable to get Trulicity from pharmacy for 6 weeks related to national shortage. Has improved diet and practicing routine exercise. Checking blood sugars as needed. She is aware of appointment with Endocrinology on 02/10/2022 and intends to keep that appointment. Would like referral to Podiatry for foot exam and toenail cutdown.   3. SKIN  CONCERN: Darkening underneath bilateral eyes. Persisting for a while. Previously followed by Dermatology and prescribed an ointment which helped. Requesting referral back to the same.    Patient Active Problem List   Diagnosis Date Noted   Cystocele with prolapse 01/30/2020   Dyslipidemia 12/17/2019   GBS bacteriuria 12/09/2019   Uncontrolled type 2 diabetes mellitus with hyperglycemia ([place]) 10/08/2019   Palpitations 10/08/2019   Essential hypertension 10/08/2019   Anemia due to GI blood loss 10/08/2019   Gastroesophageal reflux disease without esophagitis 10/08/2019   GI bleed 01/23/2019   Diabetes ([place]) 01/23/2019     Current Outpatient Medications on File Prior to Visit  Medication Sig Dispense Refill   Continuous Blood Gluc Sensor (DEXCOM G6 SENSOR) MISC 1 Device by Does not apply route as directed. 9 each 3   Continuous Blood Gluc Transmit (DEXCOM G6 TRANSMITTER) MISC 1 Device by Does not apply route as directed. 1 each 3   Dulaglutide (TRULICITY) 3 SW/1.0XN SOPN Inject 3 mg as directed once a week. 6 mL 0   glucose blood (ONETOUCH VERIO) test strip 1 each by Other route in the morning and at bedtime. Use as instructed 200 each 3   insulin glargine (LANTUS SOLOSTAR) 100 UNIT/ML Solostar Pen Inject 25 Units into the skin at bedtime. 30 mL 3   Insulin Pen Needle 32G X 4 MM MISC 1 Device by Does not apply route daily in the afternoon. 100 each 3   Lancets Misc. MISC Use as directed to check BS three times daily     mometasone (ELOCON) 0.1 % cream mometasone 0.1 % topical cream  OneTouch Delica Lancets 47S MISC Use as directed to check BS three times daily 100 each 5   polyethylene glycol powder (GLYCOLAX/MIRALAX) 17 GM/SCOOP powder Take 255 g by mouth daily. Drink 17g (1 scoop) dissolved in water per day. 255 g 11   No current facility-administered medications on file prior to visit.    No Known Allergies  Social History   Socioeconomic History   Marital status: Single     Spouse name: Not on file   Number of children: Not on file   Years of education: Not on file   Highest education level: Not on file  Occupational History   Not on file  Tobacco Use   Smoking status: Never   Smokeless tobacco: Never  Vaping Use   Vaping Use: Never used  Substance and Sexual Activity   Alcohol use: Not Currently   Drug use: Never   Sexual activity: Not Currently  Other Topics Concern   Not on file  Social History Narrative   Not on file   Social Determinants of Health   Financial Resource Strain: Not on file  Food Insecurity: Not on file  Transportation Needs: Not on file  Physical Activity: Not on file  Stress: Not on file  Social Connections: Not on file  Intimate Partner Violence: Not on file    Family History  Problem Relation Age of Onset   Colon cancer Mother    Colon polyps Mother     Past Surgical History:  Procedure Laterality Date   BUNIONECTOMY Right    COLONOSCOPY N/A 09/11/2019   Procedure: COLONOSCOPY;  Surgeon: Virgel Manifold, MD;  Location: [facility] ENDOSCOPY;  Service: Endoscopy;  Laterality: N/A;   UNILATERAL SALPINGECTOMY     due to ectopic pregnancy but she does not recall if left or right    ROS: Review of Systems Negative except as stated above  PHYSICAL EXAM: BP 135/68 (BP Location: Left Arm, Patient Position: Sitting, Cuff Size: Normal)    Pulse 75    Temp 98.3 F (36.8 C)    Resp 18    Ht 5' 7.4" (1.712 m)    Wt 175 lb (79.4 kg)    SpO2 96%    BMI 27.08 kg/m   Physical Exam HENT:     Head: Normocephalic and atraumatic.  Eyes:     Extraocular Movements: Extraocular movements intact.     Conjunctiva/sclera: Conjunctivae normal.     Pupils: Pupils are equal, round, and reactive to light.  Cardiovascular:     Rate and Rhythm: Normal rate and regular rhythm.     Pulses: Normal pulses.     Heart sounds: Normal heart sounds.  Pulmonary:     Effort: Pulmonary effort is normal.     Breath sounds: Normal breath sounds.   Musculoskeletal:     Cervical back: Normal range of motion and neck supple.  Neurological:     General: No focal deficit present.     Mental Status: She is alert and oriented to person, place, and time.  Psychiatric:        Mood and Affect: Mood normal.        Behavior: Behavior normal.   Results for orders placed or performed in visit on 02/09/22  POCT glycosylated hemoglobin (Hb A1C)  Result Value Ref Range   Hemoglobin A1C 7.0 (A) 4.0 - 5.6 %   HbA1c POC (<> result, manual entry)     HbA1c, POC (prediabetic range)     HbA1c, POC (controlled diabetic  range)      ASSESSMENT AND PLAN: 1. Essential (primary) hypertension: 2. Chest pain, unspecified type: 3. Family history of cerebrovascular accident: 4. Family history of heart disease: -  Continue Hydrochlorothiazide, Lisinopril, and Metoprolol Succinate as prescribed.  - Counseled on blood pressure goal of less than 140/90, low-sodium, DASH diet, medication compliance, 150 minutes of moderate intensity exercise per week as tolerated. Discussed medication compliance, adverse effects. - Per patient request referral to Cardiology for further evaluation and management. Specifically patient requesting testing/screening of heart function related to family history of CVA and heart disease.  - Follow-up with primary provider as scheduled. - hydrochlorothiazide (HYDRODIURIL) 25 MG tablet; Take 1 tablet (25 mg total) by mouth daily.  Dispense: 90 tablet; Refill: 0 - lisinopril (ZESTRIL) 40 MG tablet; Take 1 tablet (40 mg total) by mouth daily.  Dispense: 90 tablet; Refill: 0 - metoprolol succinate (TOPROL-XL) 50 MG 24 hr tablet; Take 1 tablet (50 mg total) by mouth daily. Take with or immediately following a meal.  Dispense: 90 tablet; Refill: 0 - Ambulatory referral to Cardiology  5. Type 2 diabetes mellitus without complication, with long-term current use of insulin ([place]): - Hemoglobin A1c today at goal at 7.0%, goal < 8%. This is greatly  improved from previous 13.2% on 06/29/2021. - Continue present management.  - Keep appointment scheduled 02/10/2022 with Collier Flowers, MD at Endocrinology. - POCT glycosylated hemoglobin (Hb A1C)  6. Encounter for diabetic foot exam ([place]): 7. Long toenail: - Referral to Podiatry for further evaluation and management.  - Ambulatory referral to Podiatry  8. Periorbital hyperpigmentation: - Chronic.  - Referral to Dermatology for further evaluation and management.  - Ambulatory referral to Dermatology  Patient was given the opportunity to ask questions.  Patient verbalized understanding of the plan and was able to repeat key elements of the plan. Patient was given clear instructions to go to Emergency Department or return to medical center if symptoms don't improve, worsen, or new problems develop.The patient verbalized understanding.   Orders Placed This Encounter  Procedures   Ambulatory referral to Cardiology   Ambulatory referral to Dermatology   Ambulatory referral to Podiatry   POCT glycosylated hemoglobin (Hb A1C)     Requested Prescriptions   Signed Prescriptions Disp Refills   hydrochlorothiazide (HYDRODIURIL) 25 MG tablet 90 tablet 0    Sig: Take 1 tablet (25 mg total) by mouth daily.   lisinopril (ZESTRIL) 40 MG tablet 90 tablet 0    Sig: Take 1 tablet (40 mg total) by mouth daily.   metoprolol succinate (TOPROL-XL) 50 MG 24 hr tablet 90 tablet 0    Sig: Take 1 tablet (50 mg total) by mouth daily. Take with or immediately following a meal.    Follow-up with primary provider as scheduled.   Camillia Herter, NP

## 2022-02-09 ENCOUNTER — Other Ambulatory Visit: Payer: Self-pay

## 2022-02-09 ENCOUNTER — Ambulatory Visit (INDEPENDENT_AMBULATORY_CARE_PROVIDER_SITE_OTHER): Payer: Medicare Other | Admitting: Family

## 2022-02-09 ENCOUNTER — Encounter: Payer: Self-pay | Admitting: Family

## 2022-02-09 VITALS — BP 135/68 | HR 75 | Temp 98.3°F | Resp 18 | Ht 67.4 in | Wt 175.0 lb

## 2022-02-09 DIAGNOSIS — L818 Other specified disorders of pigmentation: Secondary | ICD-10-CM

## 2022-02-09 DIAGNOSIS — Z823 Family history of stroke: Secondary | ICD-10-CM

## 2022-02-09 DIAGNOSIS — E1165 Type 2 diabetes mellitus with hyperglycemia: Secondary | ICD-10-CM

## 2022-02-09 DIAGNOSIS — R079 Chest pain, unspecified: Secondary | ICD-10-CM

## 2022-02-09 DIAGNOSIS — Z8249 Family history of ischemic heart disease and other diseases of the circulatory system: Secondary | ICD-10-CM

## 2022-02-09 DIAGNOSIS — L602 Onychogryphosis: Secondary | ICD-10-CM | POA: Diagnosis not present

## 2022-02-09 DIAGNOSIS — E119 Type 2 diabetes mellitus without complications: Secondary | ICD-10-CM | POA: Diagnosis not present

## 2022-02-09 DIAGNOSIS — I10 Essential (primary) hypertension: Secondary | ICD-10-CM | POA: Diagnosis not present

## 2022-02-09 DIAGNOSIS — Z794 Long term (current) use of insulin: Secondary | ICD-10-CM

## 2022-02-09 LAB — POCT GLYCOSYLATED HEMOGLOBIN (HGB A1C): Hemoglobin A1C: 7 % — AB (ref 4.0–5.6)

## 2022-02-09 NOTE — Progress Notes (Signed)
Diabetes discussed in office.

## 2022-02-09 NOTE — Progress Notes (Signed)
Pt presents for hypertension follow-up and diabetes

## 2022-02-10 ENCOUNTER — Encounter: Payer: Self-pay | Admitting: Internal Medicine

## 2022-02-10 ENCOUNTER — Ambulatory Visit (INDEPENDENT_AMBULATORY_CARE_PROVIDER_SITE_OTHER): Payer: Medicare Other | Admitting: Internal Medicine

## 2022-02-10 VITALS — BP 132/84 | HR 85 | Ht 67.0 in | Wt 175.0 lb

## 2022-02-10 DIAGNOSIS — E119 Type 2 diabetes mellitus without complications: Secondary | ICD-10-CM

## 2022-02-10 DIAGNOSIS — Z794 Long term (current) use of insulin: Secondary | ICD-10-CM | POA: Diagnosis not present

## 2022-02-10 LAB — POCT GLUCOSE (DEVICE FOR HOME USE): Glucose Fasting, POC: 190 mg/dL — AB (ref 70–99)

## 2022-02-10 MED ORDER — METOPROLOL SUCCINATE ER 50 MG PO TB24: 50.0000 mg | ORAL_TABLET | Freq: Every day | ORAL | 0 refills | Status: DC

## 2022-02-10 MED ORDER — HYDROCHLOROTHIAZIDE 25 MG PO TABS: 25.0000 mg | ORAL_TABLET | Freq: Every day | ORAL | 0 refills | Status: AC

## 2022-02-10 MED ORDER — SEMAGLUTIDE (2 MG/DOSE) 8 MG/3ML ~~LOC~~ SOPN: 2.0000 mg | PEN_INJECTOR | SUBCUTANEOUS | 3 refills | Status: DC

## 2022-02-10 MED ORDER — LISINOPRIL 40 MG PO TABS: 40.0000 mg | ORAL_TABLET | Freq: Every day | ORAL | 0 refills | Status: DC

## 2022-02-10 MED ORDER — LANTUS SOLOSTAR 100 UNIT/ML ~~LOC~~ SOPN: 25.0000 [IU] | PEN_INJECTOR | Freq: Every day | SUBCUTANEOUS | 3 refills | Status: DC

## 2022-02-10 NOTE — Progress Notes (Signed)
Name: Jessica [name]  Age/ Sex: 71 y.o., female   MRN/ DOB: [id number], 1951/10/16     PCP: Camillia Herter, NP   Reason for Endocrinology Evaluation: Type 2 Diabetes Mellitus  Initial Endocrine Consultative Visit: 12/17/2019    PATIENT IDENTIFIER: Jessica [name] is a 71 y.o. female with a past medical history of T2DM, HTN and Dyslipidemia . The patient has followed with Endocrinology clinic since 12/17/2019 for consultative assistance with management of her diabetes.  DIABETIC HISTORY:  Jessica [name] was diagnosed with T2DM  In her 2's. She is intolerant to Metformin. Her hemoglobin A1c has ranged from 7.0 % in 2019, peaking at 11.6% in 2020  On her initial visit to our clinic she had    SUBJECTIVE:   During the last visit (08/21/2021): A1c 13.2 % . We increased lantus and continued trulicity  .      Today (02/10/2022): Jessica [name] is here for a follow up on diabetes management. She checks her blood sugars 0 times daily. The patient is not known to have hypoglycemic episodes since the last clinic visit.  She has been out of Trulicity for 2 months.     HOME DIABETES REGIMEN:  Trulicity 3  mg weekly  Lantus 25 units daily -25 units       METER DOWNLOAD SUMMARY: did not bring      DIABETIC COMPLICATIONS: Microvascular complications:    Denies: CKD, retinopathy , neuropathy  Last eye exam: Completed 05/2021   Macrovascular complications:    Denies: CAD, PVD, CVA   HISTORY:  Past Medical History:  Past Medical History:  Diagnosis Date   Diabetes mellitus without complication ([place])    GI bleed 09/08/2019   Hypertension    Past Surgical History:  Past Surgical History:  Procedure Laterality Date   BUNIONECTOMY Right    COLONOSCOPY N/A 09/11/2019   Procedure: COLONOSCOPY;  Surgeon: Virgel Manifold, MD;  Location: [facility] ENDOSCOPY;  Service: Endoscopy;  Laterality: N/A;   UNILATERAL SALPINGECTOMY     due to ectopic pregnancy but she does not recall if left  or right   Social History:  reports that she has never smoked. She has never used smokeless tobacco. She reports that she does not currently use alcohol. She reports that she does not use drugs. Family History:  Family History  Problem Relation Age of Onset   Colon cancer Mother    Colon polyps Mother      HOME MEDICATIONS: Allergies as of 02/10/2022   No Known Allergies      Medication List        Accurate as of February 10, 2022 12:41 PM. If you have any questions, ask your nurse or doctor.          Dexcom G6 Sensor Misc 1 Device by Does not apply route as directed.   Dexcom G6 Transmitter Misc 1 Device by Does not apply route as directed.   hydrochlorothiazide 25 MG tablet Commonly known as: HYDRODIURIL Take 1 tablet (25 mg total) by mouth daily.   Insulin Pen Needle 32G X 4 MM Misc 1 Device by Does not apply route daily in the afternoon.   Lancets Misc. Misc Use as directed to check BS three times daily   Lantus SoloStar 100 UNIT/ML Solostar Pen Generic drug: insulin glargine Inject 25 Units into the skin at bedtime.   lisinopril 40 MG tablet Commonly known as: ZESTRIL Take 1 tablet (40 mg total) by mouth daily.   metoprolol succinate 50  MG 24 hr tablet Commonly known as: TOPROL-XL Take 1 tablet (50 mg total) by mouth daily. Take with or immediately following a meal.   mometasone 0.1 % cream Commonly known as: ELOCON mometasone 0.1 % topical cream   OneTouch Delica Lancets 10C Misc Use as directed to check BS three times daily   OneTouch Verio test strip Generic drug: glucose blood 1 each by Other route in the morning and at bedtime. Use as instructed   polyethylene glycol powder 17 GM/SCOOP powder Commonly known as: GLYCOLAX/MIRALAX Take 255 g by mouth daily. Drink 17g (1 scoop) dissolved in water per day.   Trulicity 3 HE/5.2DP Sopn Generic drug: Dulaglutide Inject 3 mg as directed once a week.         OBJECTIVE:   Vital  Signs:There were no vitals taken for this visit.  Wt Readings from Last 3 Encounters:  02/09/22 175 lb (79.4 kg)  11/19/21 171 lb (77.6 kg)  08/21/21 173 lb 9.6 oz (78.7 kg)     Exam: General: Pt appears well and is in NAD  Lungs: Clear with good BS bilat with no rales, rhonchi, or wheezes  Heart: RRR with normal S1 and S2 and no gallops; no murmurs; no rub  Abdomen: Normoactive bowel sounds, soft, nontender, without masses or organomegaly palpable  Extremities: No pretibial edema. No tremor. Normal strength and motion throughout. See detailed diabetic foot exam below.  Neuro: MS is good with appropriate affect, pt is alert and Ox3    Per pt she had foot exam at PCP yesterday 02/09/2022  DATA REVIEWED:  Lab Results  Component Value Date   HGBA1C 7.0 (A) 02/09/2022   HGBA1C 13.2 (A) 06/29/2021   HGBA1C 8.1 (A) 04/09/2020   Lab Results  Component Value Date   LDLCALC 160 (H) 10/08/2019   CREATININE 0.92 08/19/2021     Lab Results  Component Value Date   CHOL 263 (H) 10/08/2019   HDL 76 10/08/2019   LDLCALC 160 (H) 10/08/2019   TRIG 155 (H) 10/08/2019   CHOLHDL 3.5 10/08/2019       Results for Jessica [name], Jessica [name] (MRN [id number]) as of 08/25/2021 10:19  Ref. Range 08/19/2021 15:27  Sodium Latest Ref Range: 134 - 144 mmol/L 142  Potassium Latest Ref Range: 3.5 - 5.2 mmol/L 4.0  Chloride Latest Ref Range: 96 - 106 mmol/L 107 (H)  CO2 Latest Ref Range: 20 - 29 mmol/L 22  Glucose Latest Ref Range: 65 - 99 mg/dL 129 (H)  BUN Latest Ref Range: 8 - 27 mg/dL 8  Creatinine Latest Ref Range: 0.57 - 1.00 mg/dL 0.92  Calcium Latest Ref Range: 8.7 - 10.3 mg/dL 9.3  BUN/Creatinine Ratio Latest Ref Range: 12 - 28  9 (L)  eGFR Latest Ref Range: >59 mL/min/1.73 68  Alkaline Phosphatase Latest Ref Range: 44 - 121 IU/L 78  Albumin Latest Ref Range: 3.8 - 4.8 g/dL 4.1  Albumin/Globulin Ratio Latest Ref Range: 1.2 - 2.2  1.6  AST Latest Ref Range: 0 - 40 IU/L 16  ALT Latest Ref Range: 0 - 32  IU/L 14  Total Protein Latest Ref Range: 6.0 - 8.5 g/dL 6.7  Total Bilirubin Latest Ref Range: 0.0 - 1.2 mg/dL 0.3  Globulin, Total Latest Ref Range: 1.5 - 4.5 g/dL 2.6    ASSESSMENT / PLAN / RECOMMENDATIONS:   1) Type 2 Diabetes Mellitus, Optimally Controlled,  Without complications - Most recent A1c of 7.0%. Goal A1c < 7.0 %.     - Praised the  pt on improved glycemic control, A1c down from 13.2% to 7.0 %  - She has been out of trulicity for 2 months due to shortage of supplies, will switch to Ozempic  - In office BG 190 mg/dL  - She is intolerant to 1 tablet of Metformin     MEDICATIONS:   Switch Trulicity to Ozempic 2 mg weekly  Continue  Lantus 25 units daily    EDUCATION / INSTRUCTIONS: BG monitoring instructions: Patient is instructed to check her blood sugars 1 times a day, fasting  Call [facility] if: BG persistently < 70 or > 300. I reviewed the Rule of 15 for the treatment of hypoglycemia in detail with the patient. Literature supplied.     2) Diabetic complications:  Eye: Does not have known diabetic retinopathy.  Neuro/ Feet: Does not have known diabetic peripheral neuropathy. Renal: Patient does not have known baseline CKD. She is on an ACEI/ARB at present.       F/U in 6 months      Signed electronically by: Mack Guise, MD  [facility]  [facility] [address], [place] [address] Phone: [phone] FAX: [phone]   CC: Camillia Herter, NP [place] [address] Phone: [phone]  Fax: [phone]  Return to Endocrinology clinic as below: Future Appointments  Date Time Provider [place]  02/10/2022  1:40 PM Tanasha Menees, Melanie Crazier, MD [id number] None  02/16/2022  9:20 AM Jaquita Folds, MD [facility]

## 2022-02-10 NOTE — Patient Instructions (Signed)
-   Continue Lantus 25 units daily  - Switch  Trulicity to Ozempic 2 mg ONCE weekly        HOW TO TREAT LOW BLOOD SUGARS (Blood sugar LESS THAN 70 MG/DL) Please follow the RULE OF 15 for the treatment of hypoglycemia treatment (when your (blood sugars are less than 70 mg/dL)   STEP 1: Take 15 grams of carbohydrates when your blood sugar is low, which includes:  3-4 GLUCOSE TABS  OR 3-4 OZ OF JUICE OR REGULAR SODA OR ONE TUBE OF GLUCOSE GEL    STEP 2: RECHECK blood sugar in 15 MINUTES STEP 3: If your blood sugar is still low at the 15 minute recheck --> then, go back to STEP 1 and treat AGAIN with another 15 grams of carbohydrates.

## 2022-02-16 ENCOUNTER — Other Ambulatory Visit: Payer: Self-pay

## 2022-02-16 ENCOUNTER — Ambulatory Visit (INDEPENDENT_AMBULATORY_CARE_PROVIDER_SITE_OTHER): Payer: Medicare Other | Admitting: Obstetrics and Gynecology

## 2022-02-16 ENCOUNTER — Encounter: Payer: Self-pay | Admitting: Obstetrics and Gynecology

## 2022-02-16 VITALS — BP 130/82 | HR 96

## 2022-02-16 DIAGNOSIS — N393 Stress incontinence (female) (male): Secondary | ICD-10-CM | POA: Diagnosis not present

## 2022-02-16 DIAGNOSIS — N812 Incomplete uterovaginal prolapse: Secondary | ICD-10-CM | POA: Diagnosis not present

## 2022-02-16 NOTE — Progress Notes (Signed)
Urogynecology   Subjective:     Chief Complaint:  Chief Complaint  Patient presents with   Pessary Check    History of Present Illness: Jessica [name] is a 71 y.o. female with stage III pelvic organ prolapse, stress incontinence and OAB who presents for a pessary check. She is using a size 3- 1/8in incontinence dish with support pessary. She has been doing well and has no complaints. Denies vaginal bleeding.   Past Medical History: Patient  has a past medical history of Diabetes mellitus without complication ([place]), GI bleed (09/08/2019), and Hypertension.   Past Surgical History: She  has a past surgical history that includes Colonoscopy (N/A, 09/11/2019); Bunionectomy (Right); and Unilateral salpingectomy.   Medications: She has a current medication list which includes the following prescription(s): onetouch verio, hydrochlorothiazide, lantus solostar, insulin pen needle, lancets misc., lisinopril, metoprolol succinate, mometasone, onetouch delica lancets 96P, polyethylene glycol powder, and semaglutide (2 mg/dose).   Allergies: Patient has No Known Allergies.   Social History: Patient  reports that she has never smoked. She has never used smokeless tobacco. She reports that she does not currently use alcohol. She reports that she does not use drugs.     Objective:    Physical Exam: BP 130/82    Pulse 96  Gen: No apparent distress, A&O x 3.  Detailed Urogynecologic Evaluation:  Pelvic Exam: Normal external female genitalia; Bartholin's and Skene's glands normal in appearance; urethral meatus normal in appearance, no urethral masses or discharge.  The pessary was removed without difficulty. Speculum exam was performed and showed normal mucosa without lesions. The pessary was cleaned and replaced.   Prior POP-Q (10/14/20):   POP-Q   3                                            Aa   3                                           Ba   -5                                               C    3.5                                            Gh   3.5                                            Pb   9                                            tvl    -2.5  Ap   -2.5                                            Bp   -7                                              D        Assessment/Plan:    Assessment: Jessica [name] is a 71 y.o. with stage III pelvic organ prolapse, stress incontinence and OAB.Marland Kitchen    Plan:  POP/ SUI - Continue 3-1/8 in incontinence dish in place.  - Return 3 months for pessary check or sooner if needed.    Time Spent: Time spent: I spent 15 minutes dedicated to the care of this patient on the date of this encounter to include pre-visit review of records, face-to-face time with the patient and post visit documentation.

## 2022-02-19 ENCOUNTER — Ambulatory Visit: Payer: Medicare Other | Admitting: Podiatry

## 2022-02-25 ENCOUNTER — Telehealth: Payer: Self-pay | Admitting: Dermatology

## 2022-02-25 ENCOUNTER — Encounter (HOSPITAL_BASED_OUTPATIENT_CLINIC_OR_DEPARTMENT_OTHER): Payer: Self-pay

## 2022-02-25 NOTE — Telephone Encounter (Signed)
Referral. She'll call referrer back and see if they can get her in sooner elsewhere ?

## 2022-03-01 ENCOUNTER — Ambulatory Visit (INDEPENDENT_AMBULATORY_CARE_PROVIDER_SITE_OTHER): Payer: Medicare Other | Admitting: Family

## 2022-03-01 ENCOUNTER — Other Ambulatory Visit: Payer: Self-pay

## 2022-03-01 ENCOUNTER — Encounter (HOSPITAL_BASED_OUTPATIENT_CLINIC_OR_DEPARTMENT_OTHER): Payer: Self-pay | Admitting: Family

## 2022-03-01 VITALS — BP 124/72 | HR 83 | Ht 67.0 in | Wt 180.7 lb

## 2022-03-01 DIAGNOSIS — E1165 Type 2 diabetes mellitus with hyperglycemia: Secondary | ICD-10-CM

## 2022-03-01 DIAGNOSIS — R002 Palpitations: Secondary | ICD-10-CM | POA: Diagnosis not present

## 2022-03-01 DIAGNOSIS — R072 Precordial pain: Secondary | ICD-10-CM | POA: Diagnosis not present

## 2022-03-01 DIAGNOSIS — I10 Essential (primary) hypertension: Secondary | ICD-10-CM

## 2022-03-01 DIAGNOSIS — Z794 Long term (current) use of insulin: Secondary | ICD-10-CM

## 2022-03-01 DIAGNOSIS — I422 Other hypertrophic cardiomyopathy: Secondary | ICD-10-CM

## 2022-03-01 DIAGNOSIS — E782 Mixed hyperlipidemia: Secondary | ICD-10-CM

## 2022-03-01 DIAGNOSIS — R079 Chest pain, unspecified: Secondary | ICD-10-CM

## 2022-03-01 MED ORDER — ROSUVASTATIN CALCIUM 20 MG PO TABS: 20.0000 mg | ORAL_TABLET | Freq: Every day | ORAL | 3 refills | Status: AC

## 2022-03-01 MED ORDER — METOPROLOL TARTRATE 100 MG PO TABS: ORAL_TABLET | ORAL | 0 refills | Status: DC

## 2022-03-01 NOTE — Progress Notes (Signed)
? ?Office Visit  ?  ?Patient Name: Jessica [name] ?Date of Encounter: 03/01/2022 ? ?PCP:  Camillia Herter, NP ?  ?[place]  ?Cardiologist:  Donato Heinz, MD  ?Advanced Practice Provider:  No care team member to display ?Electrophysiologist:  None  ?   ? ?Chief Complaint  ?  ?Jessica [name] is a 71 y.o. female with a hx of type 2 diabetes, hypertension, hypertrophic cardiomyopathy, palpitations, hyperlipidemia presents today for cardiology follow up.  ? ?Past Medical History  ?  ?Past Medical History:  ?Diagnosis Date  ? Diabetes mellitus without complication ([place])   ? GI bleed 09/08/2019  ? Hypertension   ? ?Past Surgical History:  ?Procedure Laterality Date  ? BUNIONECTOMY Right   ? COLONOSCOPY N/A 09/11/2019  ? Procedure: COLONOSCOPY;  Surgeon: Virgel Manifold, MD;  Location: [facility] ENDOSCOPY;  Service: Endoscopy;  Laterality: N/A;  ? UNILATERAL SALPINGECTOMY    ? due to ectopic pregnancy but she does not recall if left or right  ? ? ?Allergies ? ?No Known Allergies ? ?History of Present Illness  ?  ?Jessica [name] is a 70 y.o. female with a hx of type 2 diabetes, hypertension, hypertrophic cardiomyopathy, palpitations, hyperlipidemia last seen 02/2020 by Dr. Gardiner Rhyme. ? ?Family history notable for father passing away from an MI at age 91 and sister having stents in her early 50s. ? ?TTE 11/23/2019 with moderate asymmetric basal septal hypertrophy, normal LVEF, moderate AI, normal RV function.  CMR 02/2020 asymmetric septal hypertrophy up to 15 mm consistent with HCM, patchy LGE in basal septum and RV insertion site (6% of total myocardial mass), normal LV/RV size and systolic function, trivial AI. ? ?Previous admission 06/19/2022 for GI bleed with hemoglobin 6.7 on presentation.  Colonoscopy with diverticulosis.  She had episodes of heart racing during admission and after discharge.  These were also present prior to admission.  She was seen 02/2020 by Dr. Gardiner Rhyme. ZIO monitor recommended and  unfortunately lost and no data available.  Recommended for 61-monthfollow-up which was not completed. ? ?She saw primary care 02/09/2022.  She was continued on hydrochlorothiazide 25 mg daily, lisinopril 40 mg daily, Toprol 50 mg daily.  02/09/2022 A1c 7.0.  She was referred to podiatry as well as dermatology.  She was seen by endocrinology 02/10/2022 and [place]ed [place] ? ?She presents today for follow up independently. Tells me her previously her blood pressure was markedly elevated SBP 200s and A1c 13.2. The last 3 [place] in [place] about medications, diet, exercise. She does not have a BP cuff at home. Tells me [place]  ? ?Tells me she has been having mid-sternal chest pain. Describes as aching or pressure pain. Occurred while sitting at her computer. She had one episode of pain down her left arm which resolved with changing position of her arm. Her sister recently had more stents placed. Concerned about her own heart health. She exercises doing aerobics, walking, dancing on and off due to her part time work schedule. She does not notice chest pain or exertional dyspnea with these activities. She notes palpitations that are similar to previous. They last a couple minutes and self resolve. She has difficulty quantifying frequency.  ? ?EKGs/Labs/Other Studies Reviewed:  ? ?The following studies were reviewed today: ? ?TTE 11/23/19: ? 1. Left ventricular ejection fraction,  by visual estimation, is 60 to  ?65%. The left ventricle has normal function. There is moderately increased  ?left ventricular hypertrophy.  ? 2. Asymmetric basal septal hypertrophy measuring up to 3m (163min  ?posterior wall)  ? 3. The average left ventricular global longitudinal strain is -18.3 %.  ? 4. Left ventricular diastolic parameters are indeterminate.  ? 5. Global right ventricle has normal  systolic function.The right  ?ventricular size is normal. No increase in right ventricular wall  ?thickness.  ? 6. Left atrial size was normal.  ? 7. Right atrial size was normal.  ? 8. The mitral valve is normal in structure. No evidence of mitral valve  ?regurgitation.  ? 9. The tricuspid valve is normal in structure. Tricuspid valve  ?regurgitation is trivial.  ?10. The aortic valve is tricuspid. Aortic valve regurgitation is moderate.  ?No evidence of aortic valve sclerosis or stenosis.  ?11. The pulmonic valve was grossly normal. Pulmonic valve regurgitation is  ?mild.  ?12. The inferior vena cava is normal in size with greater than 50%  ?respiratory variability, suggesting right atrial pressure of 3 mmHg.  ?  ?CMR 03/04/20: ?1. Asymmetric hypertrophy measuring up to 1533mn basal septum (7mm2mn posterior wall), consistent with hypertrophic cardiomyopathy ?2. Patchy LGE in basal septum and RV insertion site, consistent with ?HCM. LGE accounts for 6% of total myocardial mass ?3.  Normal LV size and systolic function (EF 63%)63%.  Normal RV size and systolic function (EF 55%)01%?EKG:  EKG is ordered today.  The ekg ordered today demonstrates NSR 72 bpm with no acute ST/T wave changes.  ? ?Recent Labs: ?07/04/2021: Hemoglobin 13.9; Platelets 285 ?08/19/2021: ALT 14; BUN 8; Creatinine, Ser 0.92; Potassium 4.0; Sodium 142  ?Recent Lipid Panel ?   ?Component Value Date/Time  ? CHOL 263 (H) 10/08/2019 1331  ? TRIG 155 (H) 10/08/2019 1331  ? HDL 76 10/08/2019 1331  ? CHOLHDL 3.5 10/08/2019 1331  ? LDL[place]. [place] (H) 10/08/2019 1331  ? ?Home Medications  ? ?No outpatient medications have been marked as taking for the 03/01/22 encounter (Appointment) with WalkLoel Dubonnet.  ?  ? ?Review of Systems  ?   ?All other systems reviewed and are otherwise negative except as noted above. ? ?Physical Exam  ?  ?VS:  There were no vitals taken for this visit. , BMI There is no height or weight on file to calculate BMI. ? ?Wt Readings  from Last 3 Encounters:  ?02/10/22 175 lb (79.4 kg)  ?02/09/22 175 lb (79.4 kg)  ?11/19/21 171 lb (77.6 kg)  ?  ? ?GEN: Well nourished, well developed, in no acute distress. ?HEENT: normal. ?Neck: Supple, no JVD, carotid bruits, or masses. ?Cardiac: RRR, no murmurs, rubs, or gallops. No clubbing, cyanosis, edema.  Radials/PT 2+ and equal bilaterally.  ?Respiratory:  Respirations regular and unlabored, clear to auscultation bilaterally. ?GI: Soft, nontender, nondistended. ?MS: No deformity or atrophy. ?Skin: Warm and dry, no rash. ?Neuro:  Strength and sensation are intact. ?Psych: Normal affect. ? ?Assessment & Plan  ?  ?Chest pain in adult / family history of cardiovascular disease - Notes chest pain which is midsternal and occurs at rest. Describes as aching or pressure. Given family history of coronary artery disease, plan for ischemic evaluation. Plan for Cardiac CTA. BPM today. Will take Metoprolol tartrate '100mg'$  2 hours prior to scan.  ? ?Palpitations - Previous ZIO monitor lost in mail. Given concurrent HCM concern for arrhythmia. Reports palpitations are  infrequent and self resolve after a few minutes. She prefers to proceed with only one modality of testing at one time when offered ZIO. Consider ZIO at follow up.  ? ?HCM - CMR 03/04/20 asymmetric septal hypertrophy up to 57m consistent with HCM, patchy LGE in basal septum and RV insertion site (6% total mycocardial mass). No LVOT obstruction on TTE 11/2019. Recommend first degree relatives be screened for HCM. Continue optimal BP control.  ? ?HTN - BP well controlled. Continue current antihypertensive regimen.   ? ?HLD - Not on statin. Per guidelines should be on statin given diagnosis DM2. Update direct LDL today. Start Rosuvastatin 20g QD.  ? ?DM2 - Follows with endocrinology. Does not tolerate Semaglutide - encouraged to reach out to endocrinologist. She took one dose and it made her feel very upset to her stomach.  ? ?Disposition: Follow up in 6 week(s)  with CDonato Heinz MD or APP. ? ?Signed, ?CLoel Dubonnet NP ?03/01/2022, 1:48 PM ?[place]?

## 2022-03-01 NOTE — Patient Instructions (Addendum)
Medication Instructions:  ?Your physician has recommended you make the following change in your medication:  ?  ?START Rosuvastatin '20mg'$  daily  ?*this helps lower your cholesterol and reduce your cardiovascular risk ? ?*If you need a refill on your cardiac medications before your next appointment, please call your pharmacy* ? ? ?Lab Work: ?Your physician recommends that you return for lab work today: BMP, direct LDL ? ?If you have labs (blood work) drawn today and your tests are completely normal, you will receive your results only by: ?MyChart Message (if you have MyChart) OR ?A paper copy in the mail ?If you have any lab test that is abnormal or we need to change your treatment, we will call you to review the results. ? ? ?Testing/Procedures: ? ?Your physician has requested that you have cardiac CT. Cardiac computed tomography (CT) is a painless test that uses an x-ray machine to take clear, detailed pictures of your heart.Please follow instruction sheet as given. ? ?Follow-Up: ?At [facility], you and your health needs are our priority.  As part of our continuing mission to provide you with exceptional heart care, we have created designated Provider Care Teams.  These Care Teams include your primary Cardiologist (physician) and Advanced Practice Providers (APPs -  Physician Assistants and Nurse Practitioners) who all work together to provide you with the care you need, when you need it. ? ?We recommend signing up for the patient portal called "MyChart".  Sign up information is provided on this After Visit Summary.  MyChart is used to connect with patients for Virtual Visits (Telemedicine).  Patients are able to view lab/test results, encounter notes, upcoming appointments, etc.  Non-urgent messages can be sent to your provider as well.   ?To learn more about what you can do with MyChart, go to [email or web address].   ? ?Your next appointment:   ?April 25th at 10:30 AM with Loel Dubonnet, NP  ? ?Other  Instructions ? ?To prevent palpitations: ?Make sure you are adequately hydrated.  ?Avoid and/or limit caffeine containing beverages like soda or tea. ?Exercise regularly.  ?Manage stress well. ?Some over the counter medications can cause palpitations such as Benadryl, AdvilPM, TylenolPM. Regular Advil or Tylenol do not cause palpitations.    ?___________________________________________________________- ? ? ? ?Your cardiac CT will be scheduled at one of the below locations:  ? ?[facility] ?[address] ?[place], [place] [address] ?[phone] ? ? ?If scheduled at [facility], please arrive at the [facility] and Children's Entrance (Entrance C2) of [facility] 30 minutes prior to test start time. ?You can use the FREE valet parking offered at entrance C (encouraged to control the heart rate for the test)  ?Proceed to the [facility] Radiology Department (first floor) to check-in and test prep. ? ?All radiology patients and guests should use entrance C2 at [facility], accessed from [facility], even though the hospital's physical address listed is [address] ? ? ? ? ? ?Please follow these instructions carefully (unless otherwise directed): ? ? ?On the Night Before the Test: ?Be sure to Drink plenty of water. ?Do not consume any caffeinated/decaffeinated beverages or chocolate 12 hours prior to your test. ?Do not take any antihistamines 12 hours prior to your test. ? ? ?On the Day of the Test: ?Drink plenty of water until 1 hour prior to the test. ?Do not eat any food 4 hours prior to the test. ?You may take your regular medications prior to  the test.  ?Take metoprolol (Lopressor) two hours prior to test. ?HOLD Hydrochlorothiazide morning of the test. ?FEMALES- please wear underwire-free bra if available, avoid dresses & tight clothing ? ?     ?After the Test: ?Drink plenty of water. ?After receiving IV contrast, you may experience a mild flushed feeling.  This is normal. ?On occasion, you may experience a mild rash up to 24 hours after the test. This is not dangerous. If this occurs, you can take Benadryl 25 mg and increase your fluid intake. ?If you experience trouble breathing, this can be serious. If it is severe call 911 IMMEDIATELY. If it is mild, please call our office. ?If you take any of these medications: Glipizide/Metformin, Avandament, Glucavance, please do not take 48 hours after completing test unless otherwise instructed. ? ?We will call to schedule your test 2-4 weeks out understanding that some insurance companies will need an authorization prior to the service being performed.  ? ?For non-scheduling related questions, please contact the cardiac imaging nurse navigator should you have any questions/concerns: ?Marchia Bond, Cardiac Imaging Nurse Navigator ?Gordy Clement, Cardiac Imaging Nurse Navigator ?[facility] ?Direct Office Dial: [phone]  ? ?For scheduling needs, including cancellations and rescheduling, please call [place], [phone].  ?

## 2022-03-02 ENCOUNTER — Telehealth (HOSPITAL_BASED_OUTPATIENT_CLINIC_OR_DEPARTMENT_OTHER): Payer: Self-pay

## 2022-03-02 LAB — COMPREHENSIVE METABOLIC PANEL
ALT: 16 IU/L (ref 0–32)
AST: 13 IU/L (ref 0–40)
Albumin/Globulin Ratio: 1.6 (ref 1.2–2.2)
Albumin: 4.1 g/dL (ref 3.8–4.8)
Alkaline Phosphatase: 82 IU/L (ref 44–121)
BUN/Creatinine Ratio: 6 — ABNORMAL LOW (ref 12–28)
BUN: 6 mg/dL — ABNORMAL LOW (ref 8–27)
Bilirubin Total: 0.2 mg/dL (ref 0.0–1.2)
CO2: 25 mmol/L (ref 20–29)
Calcium: 9.7 mg/dL (ref 8.7–10.3)
Chloride: 107 mmol/L — ABNORMAL HIGH (ref 96–106)
Creatinine, Ser: 0.95 mg/dL (ref 0.57–1.00)
Globulin, Total: 2.5 g/dL (ref 1.5–4.5)
Glucose: 151 mg/dL — ABNORMAL HIGH (ref 70–99)
Potassium: 4.5 mmol/L (ref 3.5–5.2)
Sodium: 143 mmol/L (ref 134–144)
Total Protein: 6.6 g/dL (ref 6.0–8.5)
eGFR: 65 mL/min/{1.73_m2} (ref >59)

## 2022-03-02 LAB — LDL CHOLESTEROL, DIRECT: LDL Direct: 118 mg/dL — ABNORMAL HIGH (ref 0–99)

## 2022-03-02 NOTE — Telephone Encounter (Addendum)
Results called to patient who verbalizes understanding!  ? ? ? ?----- Message from Loel Dubonnet, NP sent at 03/02/2022 11:41 AM EDT ----- ?Normal kidney function,liver function, electrolytes. Proceed with cardiac CTA as discussed in clinic visit. LDL of 118. Will recheck after 8 weeks of Crestor as discussed in clinic visit.  ?

## 2022-03-11 ENCOUNTER — Telehealth (HOSPITAL_COMMUNITY): Payer: Self-pay | Admitting: *Deleted

## 2022-03-11 NOTE — Telephone Encounter (Signed)
Attempted to call patient regarding upcoming cardiac CT appointment. °Left message on voicemail with name and callback number ° °Antania Hoefling RN Navigator Cardiac Imaging °[facility] °[phone] Office °[phone] Cell ° °

## 2022-03-12 ENCOUNTER — Telehealth (HOSPITAL_COMMUNITY): Payer: Self-pay | Admitting: *Deleted

## 2022-03-12 NOTE — Telephone Encounter (Signed)
Second attempt to call patient regarding upcoming cardiac CT appointment. Left message on voicemail with name and callback number  Morio Widen RN Navigator Cardiac Imaging [facility] [phone] Office [phone] Cell  

## 2022-03-15 ENCOUNTER — Ambulatory Visit (HOSPITAL_COMMUNITY)
Admission: RE | Admit: 2022-03-15 | Discharge: 2022-03-15 | Disposition: A | Discharge status: Home / Self Care | Payer: Medicare Other | Source: Ambulatory Visit | Attending: Family | Admitting: Family

## 2022-03-15 ENCOUNTER — Telehealth (HOSPITAL_BASED_OUTPATIENT_CLINIC_OR_DEPARTMENT_OTHER): Payer: Self-pay

## 2022-03-15 ENCOUNTER — Other Ambulatory Visit: Payer: Self-pay

## 2022-03-15 DIAGNOSIS — R072 Precordial pain: Secondary | ICD-10-CM | POA: Diagnosis present

## 2022-03-15 MED ORDER — DILTIAZEM HCL 25 MG/5ML IV SOLN
10.0000 mg | Freq: Once | INTRAVENOUS | Status: AC
  Administered 2022-03-15: 10 mg via INTRAVENOUS

## 2022-03-15 MED ORDER — DILTIAZEM HCL 25 MG/5ML IV SOLN
INTRAVENOUS | Status: AC
  Filled 2022-03-15: qty 5

## 2022-03-15 MED ORDER — NITROGLYCERIN 0.4 MG SL SUBL
0.8000 mg | SUBLINGUAL_TABLET | Freq: Once | SUBLINGUAL | Status: AC
  Administered 2022-03-15: 0.8 mg via SUBLINGUAL

## 2022-03-15 MED ORDER — METOPROLOL TARTRATE 5 MG/5ML IV SOLN: 10.0000 mg | Freq: Once | INTRAVENOUS | Status: AC

## 2022-03-15 MED ORDER — NITROGLYCERIN 0.4 MG SL SUBL
SUBLINGUAL_TABLET | SUBLINGUAL | Status: AC
  Filled 2022-03-15: qty 2

## 2022-03-15 MED ORDER — ASPIRIN EC 81 MG PO TBEC: 81.0000 mg | DELAYED_RELEASE_TABLET | Freq: Every day | ORAL | 3 refills | Status: AC

## 2022-03-15 MED ORDER — IOHEXOL 350 MG/ML SOLN
100.0000 mL | Freq: Once | INTRAVENOUS | Status: AC | PRN
  Administered 2022-03-15: 100 mL via INTRAVENOUS

## 2022-03-15 MED ORDER — METOPROLOL TARTRATE 5 MG/5ML IV SOLN
INTRAVENOUS | Status: AC
  Administered 2022-03-15: 10 mg via INTRAVENOUS
  Filled 2022-03-15: qty 10

## 2022-03-15 NOTE — Telephone Encounter (Addendum)
Results called to patient who verbalizes understanding!  ? ?Medications ordered!  ? ? ?----- Message from Loel Dubonnet, NP sent at 03/15/2022  4:29 PM EDT ----- ?Cardiac CTA with coronary calcium score of 199 showing nonobstructive mild stenosis in two arteries of the heart. Not significant enough to cause chest pain. Recommend secondary prevention with Aspirin EC '81mg'$  daily, Rosuvastatin '20mg'$  daily. Follow up as scheduled. ?

## 2022-03-23 ENCOUNTER — Encounter: Payer: Self-pay | Admitting: Podiatry

## 2022-03-23 ENCOUNTER — Ambulatory Visit (INDEPENDENT_AMBULATORY_CARE_PROVIDER_SITE_OTHER): Payer: Medicare Other | Admitting: Podiatry

## 2022-03-23 DIAGNOSIS — E119 Type 2 diabetes mellitus without complications: Secondary | ICD-10-CM | POA: Diagnosis not present

## 2022-03-23 NOTE — Progress Notes (Signed)
This patient presents to the office for diabetic foot exam.  Patient was referred to the office by Durene Fruits .  This patient says there is no pain or discomfort in her feet.  No history of infection or drainage.  This patient presents to the office for foot exam due to having a history of diabetes. ? ?Vascular  Dorsalis pedis and posterior tibial pulses are palpable  B/L.  Capillary return  WNL.  Temperature gradient is  WNL.  Skin turgor  WNL ? ?Sensorium  Senn Weinstein monofilament wire  WNL. Normal tactile sensation. ? ?Nail Exam  Patient has normal nails with no evidence of bacterial or fungal infection. ? ?Orthopedic  Exam  Muscle tone and muscle strength  WNL.  No limitations of motion feet  B/L.  No crepitus or joint effusion noted.  Foot type is unremarkable and digits show no abnormalities.  Bony prominences are unremarkable.  HAV  B/L. ? ?Skin  No open lesions.  Normal skin texture and turgor.  ? ?Diabetes with no complications ? ?Diabetic foot exam was performed.  There is no evidence of vascular or neurologic pathology. History of ecchymosis which has resolved. RTC 2 years.   ? ? ?Gardiner Barefoot DPM   ?

## 2022-04-13 ENCOUNTER — Ambulatory Visit (HOSPITAL_BASED_OUTPATIENT_CLINIC_OR_DEPARTMENT_OTHER): Payer: Medicare Other | Admitting: Family

## 2022-04-16 ENCOUNTER — Encounter (HOSPITAL_BASED_OUTPATIENT_CLINIC_OR_DEPARTMENT_OTHER): Payer: Self-pay | Admitting: Family

## 2022-04-16 ENCOUNTER — Ambulatory Visit (INDEPENDENT_AMBULATORY_CARE_PROVIDER_SITE_OTHER): Payer: Medicare Other | Admitting: Family

## 2022-04-16 VITALS — BP 132/80 | HR 88 | Ht 69.0 in | Wt 180.9 lb

## 2022-04-16 DIAGNOSIS — I422 Other hypertrophic cardiomyopathy: Secondary | ICD-10-CM | POA: Diagnosis not present

## 2022-04-16 DIAGNOSIS — E1165 Type 2 diabetes mellitus with hyperglycemia: Secondary | ICD-10-CM | POA: Diagnosis not present

## 2022-04-16 DIAGNOSIS — Z794 Long term (current) use of insulin: Secondary | ICD-10-CM

## 2022-04-16 DIAGNOSIS — E785 Hyperlipidemia, unspecified: Secondary | ICD-10-CM | POA: Diagnosis not present

## 2022-04-16 DIAGNOSIS — I25118 Atherosclerotic heart disease of native coronary artery with other forms of angina pectoris: Secondary | ICD-10-CM | POA: Diagnosis not present

## 2022-04-16 NOTE — Progress Notes (Signed)
? ?Office Visit  ?  ?Patient Name: Jessica [name] ?Date of Encounter: 04/16/2022 ? ?PCP:  Camillia Herter, NP ?  ?[place]  ?Cardiologist:  Donato Heinz, MD  ?Advanced Practice Provider:  No care team member to display ?Electrophysiologist:  None  ?   ? ?Chief Complaint  ?  ?[name] is a 71 y.o. female with a hx of type 2 diabetes, hypertension, hypertrophic cardiomyopathy, palpitations, hyperlipidemia presents today for follow up after cardiac CTA. ? ?Past Medical History  ?  ?Past Medical History:  ?Diagnosis Date  ? Diabetes mellitus without complication ([place])   ? GI bleed 09/08/2019  ? Hypertension   ? ?Past Surgical History:  ?Procedure Laterality Date  ? BUNIONECTOMY Right   ? COLONOSCOPY N/A 09/11/2019  ? Procedure: COLONOSCOPY;  Surgeon: Virgel Manifold, MD;  Location: [facility] ENDOSCOPY;  Service: Endoscopy;  Laterality: N/A;  ? UNILATERAL SALPINGECTOMY    ? due to ectopic pregnancy but she does not recall if left or right  ? ? ?Allergies ? ?No Known Allergies ? ?History of Present Illness  ?  ?[name] is a 71 y.o. female with a hx of type 2 diabetes, hypertension, hypertrophic cardiomyopathy, palpitations, hyperlipidemia last seen 03/01/22 ? ?Family history notable for father passing away from an MI at age 57 and sister having stents in her early 59s. ? ?TTE 11/23/2019 with moderate asymmetric basal septal hypertrophy, normal LVEF, moderate AI, normal RV function.  CMR 02/2020 asymmetric septal hypertrophy up to 15 mm consistent with HCM, patchy LGE in basal septum and RV insertion site (6% of total myocardial mass), normal LV/RV size and systolic function, trivial AI. ? ?Previous admission 06/19/2022 for GI bleed with hemoglobin 6.7 on presentation.  Colonoscopy with diverticulosis.  She had episodes of heart racing during admission and after discharge.  These were also present prior to admission.  She was seen 02/2020 by Dr. Gardiner Rhyme. ZIO monitor recommended and  unfortunately lost and no data available.  Recommended for 32-monthfollow-up which was not completed. ? ?She saw primary care 02/09/2022.  She was continued on hydrochlorothiazide 25 mg daily, lisinopril 40 mg daily, Toprol 50 mg daily.  02/09/2022 A1c 7.0.  She was referred to podiatry as well as dermatology.  She was seen by endocrinology 02/10/2022 and transitioned from Trulicity to [place] ? ?She was seen in follow-up 03/01/2022.  She noted intermittent episodes of chest pain and cardiac CT ordered.  Cardiac CTA 03/15/2022 with coronary calcium score of 199 placing her in the 83rd percentile for age and gender.  She was noted to have mild calcified plaque in the proximal LAD (25-49%) and mild mixed plaque in the proximal RCA (1-21%) and mixed plaque in the mid RCA (25-49%). ? ?She presents today for follow up independently.  We reviewed her cardiac CTA and she was reassured by result. [place] travel agent[place] Tells me she has some good days and some bad days. She exercises doing aerobics, walking, dancing on and off due to her part time work schedule.  She notes intermittent chest pain most often with stress.  We discussed that this is not cardiac related and she was reassured by cardiac CT result.  Reports no dyspnea, edema, orthopnea.  Her palpitations are very intermittent and overall not bothersome. ? ? ?EKGs/Labs/Other Studies Reviewed:  ? ?The following  studies were reviewed today: ? ?TTE 11/23/19: ? 1. Left ventricular ejection fraction, by visual estimation, is 60 to  ?65%. The left ventricle has normal function. There is moderately increased  ?left ventricular hypertrophy.  ? 2. Asymmetric basal septal hypertrophy measuring up to 89m (191min  ?posterior wall)  ? 3. The average left ventricular global longitudinal strain is -18.3 %.  ? 4. Left ventricular diastolic parameters  are indeterminate.  ? 5. Global right ventricle has normal systolic function.The right  ?ventricular size is normal. No increase in right ventricular wall  ?thickness.  ? 6. Left atrial size was normal.  ? 7. Right atrial size was normal.  ? 8. The mitral valve is normal in structure. No evidence of mitral valve  ?regurgitation.  ? 9. The tricuspid valve is normal in structure. Tricuspid valve  ?regurgitation is trivial.  ?10. The aortic valve is tricuspid. Aortic valve regurgitation is moderate.  ?No evidence of aortic valve sclerosis or stenosis.  ?11. The pulmonic valve was grossly normal. Pulmonic valve regurgitation is  ?mild.  ?12. The inferior vena cava is normal in size with greater than 50%  ?respiratory variability, suggesting right atrial pressure of 3 mmHg.  ?  ?CMR 03/04/20: ?1. Asymmetric hypertrophy measuring up to 1558mn basal septum (7mm85mn posterior wall), consistent with hypertrophic cardiomyopathy ?2. Patchy LGE in basal septum and RV insertion site, consistent with ?HCM. LGE accounts for 6% of total myocardial mass ?3.  Normal LV size and systolic function (EF 63%)53%.  Normal RV size and systolic function (EF 55%)97%?EKG:  No EKG today.  ? ?Recent Labs: ?07/04/2021: Hemoglobin 13.9; Platelets 285 ?03/01/2022: ALT 16; BUN 6; Creatinine, Ser 0.95; Potassium 4.5; Sodium 143  ?Recent Lipid Panel ?   ?Component Value Date/Time  ? CHOL 263 (H) 10/08/2019 1331  ? TRIG 155 (H) 10/08/2019 1331  ? HDL 76 10/08/2019 1331  ? CHOLHDL 3.5 10/08/2019 1331  ? LDLCR[place] (H) 10/08/2019 1331  ? LDLDIRECT 118 (H) 03/01/2022 1609  ? ?Home Medications  ? ?Current Meds  ?Medication Sig  ? aspirin EC 81 MG tablet Take 1 tablet (81 mg total) by mouth daily. Swallow whole.  ? glucose blood (ONETOUCH VERIO) test strip 1 each by Other route in the morning and at bedtime. Use as instructed  ? hydrochlorothiazide (HYDRODIURIL) 25 MG tablet Take 1 tablet (25 mg total) by mouth daily.  ? insulin glargine (LANTUS SOLOSTAR)  100 UNIT/ML Solostar Pen Inject 25 Units into the skin at bedtime.  ? Insulin Pen Needle 32G X 4 MM MISC 1 Device by Does not apply route daily in the afternoon.  ? Lancets Misc. MISC Use as directed to check BS three times daily  ? lisinopril (ZESTRIL) 40 MG tablet Take 1 tablet (40 mg total) by mouth daily.  ? metoprolol succinate (TOPROL-XL) 50 MG 24 hr tablet Take 1 tablet (50 mg total) by mouth daily. Take with or immediately following a meal.  ? mometasone (ELOCON) 0.1 % cream mometasone 0.1 % topical cream  ? OneTouch Delica Lancets 33G 67HC Use as directed to check BS three times daily  ? rosuvastatin (CRESTOR) 20 MG tablet Take 1 tablet (20 mg total) by mouth daily.  ? TRULICITY 3 MG/0AL/9.3XTN SMARTSIG:3 Milligram(s) SUB-Q Once a Week  ?  ?Review of Systems  ?   ?All other systems reviewed and are otherwise negative except as noted above. ? ?Physical Exam  ?  ?VS:  BP 132/80  Pulse 88   Ht '5\' 9"'$  (1.753 m)   Wt 180 lb 14.4 oz (82.1 kg)   SpO2 96%   BMI 26.71 kg/m?  , BMI Body mass index is 26.71 kg/m?. ? ?Wt Readings from Last 3 Encounters:  ?04/16/22 180 lb 14.4 oz (82.1 kg)  ?03/01/22 180 lb 11.2 oz (82 kg)  ?02/10/22 175 lb (79.4 kg)  ?  ? ?GEN: Well nourished, well developed, in no acute distress. ?HEENT: normal. ?Neck: Supple, no JVD, carotid bruits, or masses. ?Cardiac: RRR, no murmurs, rubs, or gallops. No clubbing, cyanosis, edema.  Radials/PT 2+ and equal bilaterally.  ?Respiratory:  Respirations regular and unlabored, clear to auscultation bilaterally. ?GI: Soft, nontender, nondistended. ?MS: No deformity or atrophy. ?Skin: Warm and dry, no rash. ?Neuro:  Strength and sensation are intact. ?Psych: Normal affect. ? ?Assessment & Plan  ?  ?CAD - Coronary calcium score 02/2022 at 199 placing her in the 83rd percentile for age and gender.  She had noted mild calcified plaque in the proximal LAD, mild mixed plaque in the proximal RCA, mild mixed plaque in the mid RCA.  Recommended for medical  management.  GDMT includes aspirin, rosuvastatin, metoprolol. Heart healthy diet and regular cardiovascular exercise encouraged.   ? ?Palpitations - Previous ZIO monitor lost in mail. Given concurrent HCM concern for arrh

## 2022-04-16 NOTE — Patient Instructions (Signed)
Medication Instructions:  ?Continue your current medications.  ? ?*If you need a refill on your cardiac medications before your next appointment, please call your pharmacy* ? ? ?Lab Work: ?Your physician recommends that you return for lab work in 2 months for fasting lipid panel and CMP. ? ?Please return for Lab work. You may come to the...  ? ?[place] (3rd floor) ?[address] [place], [place]  ?Open: 8am-Noon and 1pm-4:30pm  ? ?[place] [facility] ?[place]  ? ?[employer]- Any location ? ?**no appointments needed**  ? ?If you have labs (blood work) drawn today and your tests are completely normal, you will receive your results only by: ?MyChart Message (if you have MyChart) OR ?A paper copy in the mail ?If you have any lab test that is abnormal or we need to change your treatment, we will call you to review the results. ? ? ?Testing/Procedures: ?Your cardiac CTA showed mild nonobstructive plaque in your heart which we will treat with medications.  ? ?For coronary artery disease often called "heart disease" we aim for optimal guideline directed medical therapy. We use the "A, B, C"s to help keep [place] on track! ? ?A = Aspirin '81mg'$  daily ?B = Beta blocker which helps to relax the heart. This is your Metoprolol. ?C = Cholesterol control. You take Rosuvastatin to help control your cholesterol. ? ?Follow-Up: ?At [facility], you and your health needs are our priority.  As part of our continuing mission to provide you with exceptional heart care, we have created designated Provider Care Teams.  These Care Teams include your primary Cardiologist (physician) and Advanced Practice Providers (APPs -  Physician Assistants and Nurse Practitioners) who all work together to provide you with the care you need, when you need it. ? ?We recommend signing up for the patient portal called "MyChart".  Sign up information is provided on this After Visit Summary.   MyChart is used to connect with patients for Virtual Visits (Telemedicine).  Patients are able to view lab/test results, encounter notes, upcoming appointments, etc.  Non-urgent messages can be sent to your provider as well.   ?To learn more about what you can do with MyChart, go to [email or web address].   ? ?Your next appointment:   ?4-6 month(s) ? ?The format for your next appointment:   ?In Person ? ?Provider:   ?Donato Heinz, MD   ? ? ?Other Instructions ? ? ?High Cholesterol ? ?High cholesterol is a condition in which the blood has high levels of a white, waxy substance similar to fat (cholesterol). The liver makes all the cholesterol that the body needs. The human body needs small amounts of cholesterol to help build cells. A person gets extra or excess cholesterol from the food that he or she eats. ?The blood carries cholesterol from the liver to the rest of the body. If you have high cholesterol, deposits (plaques) may build up on the walls of your arteries. Arteries are the blood vessels that carry blood away from your heart. These plaques make the arteries narrow and stiff. ?Cholesterol plaques increase your risk for heart attack and stroke. Work with your health care provider to keep your cholesterol levels in a healthy range. ?What increases the risk? ?The following factors may make you more likely to develop this condition: ?Eating foods that are high in animal fat (saturated fat) or cholesterol. ?Being overweight. ?Not getting enough exercise. ?A family history of high cholesterol (familial hypercholesterolemia). ?Use  of tobacco products. ?Having diabetes. ?What are the signs or symptoms? ?In most cases, high cholesterol does not usually cause any symptoms. ?In severe cases, very high cholesterol levels can cause: ?Fatty bumps under the skin (xanthomas). ?A white or gray ring around the black center (pupil) of the eye. ?How is this diagnosed? ?This condition may be diagnosed based on the  results of a blood test. ?If you are older than 70 years of age, your health care provider may check your cholesterol levels every 4-6 years. ?You may be checked more often if you have high cholesterol or other risk factors for heart disease. ?The blood test for cholesterol measures: ?"Bad" cholesterol, or LDL cholesterol. This is the main type of cholesterol that causes heart disease. The desired level is less than 100 mg/dL (2.59 mmol/L). ?"Good" cholesterol, or HDL cholesterol. HDL helps protect against heart disease by cleaning the arteries and carrying the LDL to the liver for processing. The desired level for HDL is 60 mg/dL (1.55 mmol/L) or higher. ?Triglycerides. These are fats that your body can store or burn for energy. The desired level is less than 150 mg/dL (1.69 mmol/L). ?Total cholesterol. This measures the total amount of cholesterol in your blood and includes LDL, HDL, and triglycerides. The desired level is less than 200 mg/dL (5.17 mmol/L). ?How is this treated? ?Treatment for high cholesterol starts with lifestyle changes, such as diet and exercise. ?Diet changes. You may be asked to eat foods that have more fiber and less saturated fats or added sugar. ?Lifestyle changes. These may include regular exercise, maintaining a healthy weight, and quitting use of tobacco products. ?Medicines. These are given when diet and lifestyle changes have not worked. You may be prescribed a statin medicine to help lower your cholesterol levels. ?Follow these instructions at home: ?Eating and drinking ? ?Eat a healthy, balanced diet. This diet includes: ?Daily servings of a variety of fresh, frozen, or canned fruits and vegetables. ?Daily servings of whole grain foods that are rich in fiber. ?Foods that are low in saturated fats and trans fats. These include poultry and fish without skin, lean cuts of meat, and low-fat dairy products. ?A variety of fish, especially oily fish that contain omega-3 fatty acids. Aim  to eat fish at least 2 times a week. ?Avoid foods and drinks that have added sugar. ?Use healthy cooking methods, such as roasting, grilling, broiling, baking, poaching, steaming, and stir-frying. Do not fry your food except for stir-frying. ?If you drink alcohol: ?Limit how much you have to: ?0-1 drink a day for women who are not pregnant. ?0-2 drinks a day for men. ?Know how much alcohol is in a drink. In the [place] one drink equals one 12 oz bottle of beer (355 mL), one 5 oz glass of wine (148 mL), or one 1? oz glass of hard liquor (44 mL). ?Lifestyle ? ?Get regular exercise. Aim to exercise for a total of 150 minutes a week. Increase your activity level by doing activities such as gardening, walking, and taking the stairs. ?Do not use any products that contain nicotine or tobacco. These products include cigarettes, chewing tobacco, and vaping devices, such as e-cigarettes. If you need help quitting, ask your health care provider. ?General instructions ?Take over-the-counter and prescription medicines only as told by your health care provider. ?Keep all follow-up visits. This is important. ?Where to find more information ?American Heart Association: www.heart.org ?National Heart, Lung, and Blood Institute: https://wilson-eaton.com/ ?Contact a health care provider if: ?You have trouble  achieving or maintaining a healthy diet or weight. ?You are starting an exercise program. ?You are unable to stop smoking. ?Get help right away if: ?You have chest pain. ?You have trouble breathing. ?You have discomfort or pain in your jaw, neck, back, shoulder, or arm. ?You have any symptoms of a stroke. "BE FAST" is an easy way to remember the main warning signs of a stroke: ?B - Balance. Signs are dizziness, sudden trouble walking, or loss of balance. ?E - Eyes. Signs are trouble seeing or a sudden change in vision. ?F - Face. Signs are sudden weakness or numbness of the face, or the face or eyelid drooping on one side. ?A - Arms. Signs  are weakness or numbness in an arm. This happens suddenly and usually on one side of the body. ?S - Speech. Signs are sudden trouble speaking, slurred speech, or trouble understanding what people say. ?T -

## 2022-05-18 ENCOUNTER — Encounter: Payer: Self-pay | Admitting: Obstetrics and Gynecology

## 2022-05-18 ENCOUNTER — Ambulatory Visit (INDEPENDENT_AMBULATORY_CARE_PROVIDER_SITE_OTHER): Payer: Medicare Other | Admitting: Obstetrics and Gynecology

## 2022-05-18 VITALS — BP 136/85 | HR 86 | Resp 12 | Ht 69.0 in | Wt 175.0 lb

## 2022-05-18 DIAGNOSIS — N812 Incomplete uterovaginal prolapse: Secondary | ICD-10-CM | POA: Diagnosis not present

## 2022-05-18 DIAGNOSIS — N393 Stress incontinence (female) (male): Secondary | ICD-10-CM | POA: Diagnosis not present

## 2022-05-18 NOTE — Progress Notes (Signed)
[facility]   Subjective:     Chief Complaint:  Chief Complaint  Patient presents with   Follow-up    Pessary follow up-- pt states she's noticed an odor with pessary     History of Present Illness: Jessica [name] is a 71 y.o. female with stage III pelvic organ prolapse, stress incontinence and OAB who presents for a pessary check. She is using a size 3- 1/8in incontinence dish with support pessary. Pessary has been working well but she has noticed an odor recently. Denies vaginal itching or burning.   Past Medical History: Patient  has a past medical history of Diabetes mellitus without complication ([place]), GI bleed (09/08/2019), and Hypertension.   Past Surgical History: She  has a past surgical history that includes Colonoscopy (N/A, 09/11/2019); Bunionectomy (Right); and Unilateral salpingectomy.   Medications: She has a current medication list which includes the following prescription(s): aspirin ec, onetouch verio, hydrochlorothiazide, lantus solostar, insulin pen needle, lancets misc., mometasone, onetouch delica lancets 07M, rosuvastatin, trulicity, lisinopril, and metoprolol succinate.   Allergies: Patient has No Known Allergies.   Social History: Patient  reports that she has never smoked. She has never used smokeless tobacco. She reports that she does not currently use alcohol. She reports that she does not use drugs.     Objective:    Physical Exam: BP 136/85 (BP Location: Right Arm, Patient Position: Sitting, Cuff Size: Normal)   Pulse 86   Resp 12   Ht '5\' 9"'$  (1.753 m)   Wt 175 lb (79.4 kg)   SpO2 96%   BMI 25.84 kg/m  Gen: No apparent distress, A&O x 3.  Detailed Urogynecologic Evaluation:  Pelvic Exam: Normal external female genitalia; Bartholin's and Skene's glands normal in appearance; urethral meatus normal in appearance, no urethral masses or discharge.  The pessary was removed without difficulty. Speculum exam was performed and showed normal  mucosa without lesions. The pessary was cleaned and replaced.   Prior POP-Q (10/14/20):   POP-Q   3                                            Aa   3                                           Ba   -5                                              C    3.5                                            Gh   3.5                                            Pb   9  tvl    -2.5                                            Ap   -2.5                                            Bp   -7                                              D        Assessment/Plan:    Assessment: [name] is a 71 y.o. with stage III pelvic organ prolapse, stress incontinence and OAB.   Plan:  POP/ SUI - Continue 3-1/8 in incontinence dish - No discharge noted on exam today. Recommended use of trimosan jelly daily to help with odor/ discharge.  - Offered to teach patient how to place/remove pessary herself. Demonstrated process but she does not feel she is ready. She may try at home on her own.  - Return 3 months for pessary check or sooner if needed.    Time Spent: Time spent: I spent 20 minutes dedicated to the care of this patient on the date of this encounter to include pre-visit review of records, face-to-face time with the patient and post visit documentation.

## 2022-06-14 ENCOUNTER — Encounter (HOSPITAL_BASED_OUTPATIENT_CLINIC_OR_DEPARTMENT_OTHER): Payer: Self-pay

## 2022-06-22 ENCOUNTER — Other Ambulatory Visit: Payer: Self-pay | Admitting: Internal Medicine

## 2022-06-23 ENCOUNTER — Other Ambulatory Visit: Payer: Self-pay | Admitting: Family

## 2022-06-23 DIAGNOSIS — Z1231 Encounter for screening mammogram for malignant neoplasm of breast: Secondary | ICD-10-CM

## 2022-06-27 ENCOUNTER — Other Ambulatory Visit: Payer: Self-pay | Admitting: Internal Medicine

## 2022-06-28 ENCOUNTER — Ambulatory Visit: Payer: Medicare Other

## 2022-07-01 ENCOUNTER — Ambulatory Visit
Admission: RE | Admit: 2022-07-01 | Discharge: 2022-07-01 | Disposition: A | Discharge status: Home / Self Care | Payer: Medicare Other | Source: Ambulatory Visit | Attending: Family | Admitting: Family

## 2022-07-01 DIAGNOSIS — Z1231 Encounter for screening mammogram for malignant neoplasm of breast: Secondary | ICD-10-CM

## 2022-07-05 ENCOUNTER — Other Ambulatory Visit: Payer: Self-pay | Admitting: Family

## 2022-07-05 DIAGNOSIS — R928 Other abnormal and inconclusive findings on diagnostic imaging of breast: Secondary | ICD-10-CM

## 2022-07-14 ENCOUNTER — Ambulatory Visit: Payer: Self-pay

## 2022-07-14 NOTE — Telephone Encounter (Signed)
2nd attempt, pt called, LVMTCB to discuss results further.

## 2022-07-14 NOTE — Progress Notes (Deleted)
Patient ID: Jessica [name], female    DOB: Feb 07, 1951  MRN: [id number]  CC: No chief complaint on file.   Subjective: Jessica [name] is a 71 y.o. female who presents for  Her concerns today include:  Discuss mammo   [facility]   MM DIAG BREAST TOMO UNI LEFT  Date/Time Action Taken User Additional Information  07/05/22 0844 Sign Jean Rosenthal Ordering Mode: Verbal with Read Back: Cosign Required  07/05/22 1614 Verbal Cosign Camillia Herter, NP    US BREAST LTD UNI LEFT INC AXILLA  Date/Time Action Taken User Additional Information  07/05/22 0844 Sign Jean Rosenthal Ordering Mode: Verbal with Read Back: Cosign Required  07/05/22 1614 Verbal Cosign Camillia Herter, NP     Patient Active Problem List   Diagnosis Date Noted   Type 2 diabetes mellitus without complication, with long-term current use of insulin ([place]) 02/10/2022   Cystocele with prolapse 01/30/2020   Dyslipidemia 12/17/2019   GBS bacteriuria 12/09/2019   Uncontrolled type 2 diabetes mellitus with hyperglycemia ([place]) 10/08/2019   Palpitations 10/08/2019   Essential hypertension 10/08/2019   Anemia due to GI blood loss 10/08/2019   Gastroesophageal reflux disease without esophagitis 10/08/2019   GI bleed 01/23/2019   Diabetes ([place]) 01/23/2019     Current Outpatient Medications on File Prior to Visit  Medication Sig Dispense Refill   aspirin EC 81 MG tablet Take 1 tablet (81 mg total) by mouth daily. Swallow whole. 90 tablet 3   glucose blood (ONETOUCH VERIO) test strip 1 each by Other route in the morning and at bedtime. Use as instructed 200 each 3   hydrochlorothiazide (HYDRODIURIL) 25 MG tablet Take 1 tablet (25 mg total) by mouth daily. 90 tablet 0   insulin glargine (LANTUS SOLOSTAR) 100 UNIT/ML Solostar Pen Inject 25 Units into the skin at bedtime. 30 mL 3   Insulin Pen Needle 32G X 4 MM MISC 1 Device by Does not apply route daily in the afternoon. 100 each 3   Lancets Misc. MISC  Use as directed to check BS three times daily     lisinopril (ZESTRIL) 40 MG tablet Take 1 tablet (40 mg total) by mouth daily. 90 tablet 0   metoprolol succinate (TOPROL-XL) 50 MG 24 hr tablet Take 1 tablet (50 mg total) by mouth daily. Take with or immediately following a meal. 90 tablet 0   mometasone (ELOCON) 0.1 % cream mometasone 0.1 % topical cream     OneTouch Delica Lancets 03K MISC Use as directed to check BS three times daily 100 each 5   rosuvastatin (CRESTOR) 20 MG tablet Take 1 tablet (20 mg total) by mouth daily. 90 tablet 3   TRULICITY 3 VQ/2.5ZD SOPN SMARTSIG:3 Milligram(s) SUB-Q Once a Week     No current facility-administered medications on file prior to visit.    No Known Allergies  Social History   Socioeconomic History   Marital status: Single    Spouse name: Not on file   Number of children: Not on file   Years of education: Not on file   Highest education level: Not on file  Occupational History   Not on file  Tobacco Use   Smoking status: Never   Smokeless tobacco: Never  Vaping Use   Vaping Use: Never used  Substance and Sexual Activity   Alcohol use: Not Currently   Drug use: Never   Sexual activity: Not Currently  Other Topics Concern   Not on file  Social History Narrative   Not on file   Social Determinants of Health   Financial Resource Strain: Not on file  Food Insecurity: Not on file  Transportation Needs: Not on file  Physical Activity: Not on file  Stress: Not on file  Social Connections: Not on file  Intimate Partner Violence: Not on file    Family History  Problem Relation Age of Onset   Colon cancer Mother    Colon polyps Mother     Past Surgical History:  Procedure Laterality Date   BUNIONECTOMY Right    COLONOSCOPY N/A 09/11/2019   Procedure: COLONOSCOPY;  Surgeon: Virgel Manifold, MD;  Location: [facility] ENDOSCOPY;  Service: Endoscopy;  Laterality: N/A;   UNILATERAL SALPINGECTOMY     due to ectopic pregnancy but she  does not recall if left or right    ROS: Review of Systems Negative except as stated above  PHYSICAL EXAM: There were no vitals taken for this visit.  Physical Exam  {female adult master:310786} {female adult master:310785}     Latest Ref Rng & Units 03/01/2022    4:09 PM 08/19/2021    3:27 PM 07/04/2021    2:31 PM  CMP  Glucose 70 - 99 mg/dL 151  129  190   BUN 8 - 27 mg/dL '6  8  24   '$ Creatinine 0.57 - 1.00 mg/dL 0.95  0.92  1.80   Sodium 134 - 144 mmol/L 143  142  139   Potassium 3.5 - 5.2 mmol/L 4.5  4.0  3.2   Chloride 96 - 106 mmol/L 107  107  102   CO2 20 - 29 mmol/L '25  22  24   '$ Calcium 8.7 - 10.3 mg/dL 9.7  9.3  10.1   Total Protein 6.0 - 8.5 g/dL 6.6  6.7  7.7   Total Bilirubin 0.0 - 1.2 mg/dL 0.2  0.3  0.8   Alkaline Phos 44 - 121 IU/L 82  78  64   AST 0 - 40 IU/L '13  16  12   '$ ALT 0 - 32 IU/L '16  14  12    '$ Lipid Panel     Component Value Date/Time   CHOL 263 (H) 10/08/2019 1331   TRIG 155 (H) 10/08/2019 1331   HDL 76 10/08/2019 1331   CHOLHDL 3.5 10/08/2019 1331   LDLCALC 160 (H) 10/08/2019 1331   LDLDIRECT 118 (H) 03/01/2022 1609    CBC    Component Value Date/Time   WBC 6.0 07/04/2021 1431   RBC 5.06 07/04/2021 1431   HGB 13.9 07/04/2021 1431   HGB 11.4 10/08/2019 1331   HCT 42.5 07/04/2021 1431   HCT 35.0 10/08/2019 1331   PLT 285 07/04/2021 1431   PLT 269 10/08/2019 1331   MCV 84.0 07/04/2021 1431   MCV 85 10/08/2019 1331   MCH 27.5 07/04/2021 1431   MCHC 32.7 07/04/2021 1431   RDW 12.5 07/04/2021 1431   RDW 14.1 10/08/2019 1331   LYMPHSABS 1.9 07/04/2021 1431   LYMPHSABS 2.6 10/08/2019 1331   MONOABS 0.5 07/04/2021 1431   EOSABS 0.1 07/04/2021 1431   EOSABS 0.1 10/08/2019 1331   BASOSABS 0.0 07/04/2021 1431   BASOSABS 0.0 10/08/2019 1331    ASSESSMENT AND PLAN:  There are no diagnoses linked to this encounter.   Patient was given the opportunity to ask questions.  Patient verbalized understanding of the plan and was able to repeat  key elements of the plan. Patient was given clear instructions to  go to Emergency Department or return to medical center if symptoms don't improve, worsen, or new problems develop.The patient verbalized understanding.   No orders of the defined types were placed in this encounter.    Requested Prescriptions    No prescriptions requested or ordered in this encounter    No follow-ups on file.  Camillia Herter, NP

## 2022-07-14 NOTE — Telephone Encounter (Signed)
Patient called, left VM to return the call to the office to discuss results with a nurse.   Summary: Findings from Mammogram, seeking appt   Pt called reporting that per her mammogram there were findings, she wants to follow up with her PCP asap. Pt leaves town soon. Please advise, no appt available soon enough   Best contact: [phone]

## 2022-07-14 NOTE — Telephone Encounter (Signed)
3rd attempt, pt called, LVMTCB to discuss results. Will route to practice.     Summary: Findings from Mammogram, seeking appt    Pt called reporting that per her mammogram there were findings, she wants to follow up with her PCP asap. Pt leaves town soon. Please advise, no appt available soon enough   Best contact: [phone]

## 2022-07-15 NOTE — Telephone Encounter (Signed)
Ultrasound left breast and diagnostic mammogram left breast cosigned on 07/05/2022. Patient may consider contacting [facility] to see when these will be scheduled.

## 2022-07-15 NOTE — Telephone Encounter (Signed)
Called pt advised that provider ordered diagnostic and ultrasound of the breast on 07/17, conference called [facility] [phone] pt has the appt scheduled for 08/04

## 2022-07-22 ENCOUNTER — Ambulatory Visit: Payer: Medicare Other | Admitting: Family

## 2022-07-22 DIAGNOSIS — Z712 Person consulting for explanation of examination or test findings: Secondary | ICD-10-CM

## 2022-07-23 ENCOUNTER — Inpatient Hospital Stay: Admission: RE | Admit: 2022-07-23 | Payer: Medicare Other | Source: Ambulatory Visit

## 2022-07-30 ENCOUNTER — Ambulatory Visit: Payer: Medicare Other

## 2022-07-30 ENCOUNTER — Ambulatory Visit
Admission: RE | Admit: 2022-07-30 | Discharge: 2022-07-30 | Disposition: A | Discharge status: Home / Self Care | Payer: Medicare Other | Source: Ambulatory Visit | Attending: Family | Admitting: Family

## 2022-07-30 DIAGNOSIS — R928 Other abnormal and inconclusive findings on diagnostic imaging of breast: Secondary | ICD-10-CM

## 2022-08-18 ENCOUNTER — Ambulatory Visit: Payer: Medicare Other | Admitting: Obstetrics and Gynecology

## 2022-08-31 NOTE — Progress Notes (Deleted)
Cardiology Office Note:    Date:  08/31/2022   ID:  Jessica [name], DOB Jul 03, 1951, MRN [id number]  PCP:  Jessica Herter, NP  Cardiologist:  Jessica Heinz, MD  Electrophysiologist:  None   Referring MD: Jessica Herter, NP   No chief complaint on file.    History of Present Illness:    Jessica [name] is a 71 y.o. female with a hx of type 2 diabetes, hypertension who presents for follow-up.  She was referred by Dr. Chapman [name] for an evaluation of palpitations, initially seen on 11/08/2019.  She was admitted to hospital  from 09/08/2019 through 09/12/2019 for GI bleed.  Hgb was down to 6.7 on presentation.  Colonoscopy showed diverticulosis.  She reports that she had an abnormal heart rhythm while admitted and was started on metoprolol.    TTE on 11/23/2019 showed moderate asymmetric basal septal hypertrophy, normal LV systolic function, moderate AI, normal RV function.  CMR on 03/04/2020 showed asymmetric septal hypertrophy measuring up to 15 mm consistent with HCM, patchy LGE in basal septum and RV insertion site (6% of total myocardial mass), normal LV/RV size and systolic function, trivial aortic regurgitation.  Cardiac monitor was ordered but has not been done.  Coronary CTA on 03/15/2022 showed nonobstructive CAD in RCA and LAD, calcium score 199 (83rd percentile).  Since last clinic visit,  she reports that she continues to have palpitations.  Denies any lightheadedness or chest pain.  She recently started weight watchers, has lost 2 pounds.  She is walking 2 miles per day.  She reports some dyspnea on exertion, but denies any chest pain.   Past Medical History:  Diagnosis Date   Diabetes mellitus without complication ([place])    GI bleed 09/08/2019   Hypertension     Past Surgical History:  Procedure Laterality Date   BUNIONECTOMY Right    COLONOSCOPY N/A 09/11/2019   Procedure: COLONOSCOPY;  Surgeon: Jessica Manifold, MD;  Location: [facility] ENDOSCOPY;  Service: Endoscopy;  Laterality:  N/A;   UNILATERAL SALPINGECTOMY     due to ectopic pregnancy but she does not recall if left or right    Current Medications: No outpatient medications have been marked as taking for the 09/01/22 encounter (Appointment) with Jessica Heinz, MD.     Allergies:   Patient has no known allergies.   Social History   Socioeconomic History   Marital status: Single    Spouse name: Not on file   Number of children: Not on file   Years of education: Not on file   Highest education level: Not on file  Occupational History   Not on file  Tobacco Use   Smoking status: Never   Smokeless tobacco: Never  Vaping Use   Vaping Use: Never used  Substance and Sexual Activity   Alcohol use: Not Currently   Drug use: Never   Sexual activity: Not Currently  Other Topics Concern   Not on file  Social History Narrative   Not on file   Social Determinants of Health   Financial Resource Strain: Not on file  Food Insecurity: Not on file  Transportation Needs: Not on file  Physical Activity: Not on file  Stress: Not on file  Social Connections: Not on file  Former smoker  Family History: The patient's family history includes Colon cancer in her mother; Colon polyps in her mother.  ROS:   Please see the history of present illness.     All other systems reviewed and are  negative.  EKGs/Labs/Other Studies Reviewed:    The following studies were reviewed today:   EKG:  EKG is.ordered today.  The ekg ordered most recently demonstrates normal sinus rhythm, rate 79, no ST/T abnormalities  TTE 11/23/19:  1. Left ventricular ejection fraction, by visual estimation, is 60 to  65%. The left ventricle has normal function. There is moderately increased  left ventricular hypertrophy.   2. Asymmetric basal septal hypertrophy measuring up to 63m (131min  posterior wall)   3. The average left ventricular global longitudinal strain is -18.3 %.   4. Left ventricular diastolic parameters are  indeterminate.   5. Global right ventricle has normal systolic function.The right  ventricular size is normal. No increase in right ventricular wall  thickness.   6. Left atrial size was normal.   7. Right atrial size was normal.   8. The mitral valve is normal in structure. No evidence of mitral valve  regurgitation.   9. The tricuspid valve is normal in structure. Tricuspid valve  regurgitation is trivial.  10. The aortic valve is tricuspid. Aortic valve regurgitation is moderate.  No evidence of aortic valve sclerosis or stenosis.  11. The pulmonic valve was grossly normal. Pulmonic valve regurgitation is  mild.  12. The inferior vena cava is normal in size with greater than 50%  respiratory variability, suggesting right atrial pressure of 3 mmHg.   CMR 03/04/20: 1. Asymmetric hypertrophy measuring up to 157mn basal septum (7mm37m posterior wall), consistent with hypertrophic cardiomyopathy 2. Patchy LGE in basal septum and RV insertion site, consistent with HCM. LGE accounts for 6% of total myocardial mass 3.  Normal LV size and systolic function (EF 63%)62%  Normal RV size and systolic function (EF 55%)69%ecent Labs: 03/01/2022: ALT 16; BUN 6; Creatinine, Ser 0.95; Potassium 4.5; Sodium 143  Recent Lipid Panel    Component Value Date/Time   CHOL 263 (H) 10/08/2019 1331   TRIG 155 (H) 10/08/2019 1331   HDL 76 10/08/2019 1331   CHOLHDL 3.5 10/08/2019 1331   LDLCALC 160 (H) 10/08/2019 1331   LDLDIRECT 118 (H) 03/01/2022 1609    Physical Exam:    VS:  There were no vitals taken for this visit.    Wt Readings from Last 3 Encounters:  05/18/22 175 lb (79.4 kg)  04/16/22 180 lb 14.4 oz (82.1 kg)  03/01/22 180 lb 11.2 oz (82 kg)     GEN:  Well nourished, well developed in no acute distress HEENT: Normal NECK: No JVD; No carotid bruits LYMPHATICS: No lymphadenopathy CARDIAC: RRR, no murmurs, rubs, gallops RESPIRATORY:  Clear to auscultation without rales, wheezing or  rhonchi  ABDOMEN: Soft, non-tender, non-distended MUSCULOSKELETAL:  No edema; No deformity  SKIN: Warm and dry NEUROLOGIC:  Alert and oriented x 3 PSYCHIATRIC:  Normal affect   ASSESSMENT:    No diagnosis found.  PLAN:    Hypertrophic cardiomyopathy: CMR on 03/04/2020 showed asymmetric septal hypertrophy measuring up to 15 mm consistent with HCM, patchy LGE in basal septum and RV insertion site (6% of total myocardial mass).  No LVOT obstruction on TTE.  -Recommended first-degree family members be screened for HCM. -Recommend Zio patch***  Chest pain: Reported atypical chest pain,Coronary CTA on 03/15/2022 showed nonobstructive CAD in RCA and LAD, calcium score 199 (83rd percentile). -Continue aspirin, rosuvastatin  Palpitations: Description concerning for arrhythmia.  Recommend Zio patch***  Hypertension: on Toprol-XL 50 mg daily and lisinopril 40 mg daily and HCTZ 25 mg daily  Hyperlipidemia: On rosuvastatin 20 mg daily.  LDL***  Type 2 diabetes: A1c***on 12/09/2019.  On insulin, follows with endocrinology  RTC in 3 months  Medication Adjustments/Labs and Tests Ordered: Current medicines are reviewed at length with the patient today.  Concerns regarding medicines are outlined above.  No orders of the defined types were placed in this encounter.  No orders of the defined types were placed in this encounter.   There are no Patient Instructions on file for this visit.   Signed, Jessica Heinz, MD  08/31/2022 10:51 PM    [place]

## 2022-09-01 ENCOUNTER — Ambulatory Visit: Payer: Medicare Other | Attending: Cardiology | Admitting: Cardiology

## 2022-10-08 ENCOUNTER — Telehealth: Payer: Self-pay

## 2022-10-08 ENCOUNTER — Other Ambulatory Visit: Payer: Self-pay | Admitting: Internal Medicine

## 2022-10-08 DIAGNOSIS — E119 Type 2 diabetes mellitus without complications: Secondary | ICD-10-CM

## 2022-10-08 MED ORDER — LANTUS SOLOSTAR 100 UNIT/ML ~~LOC~~ SOPN: 25.0000 [IU] | PEN_INJECTOR | Freq: Every day | SUBCUTANEOUS | 1 refills | Status: DC

## 2022-10-08 MED ORDER — INSULIN PEN NEEDLE 32G X 4 MM MISC: 1.0000 | Freq: Every day | 1 refills | Status: AC

## 2022-10-08 MED ORDER — TRULICITY 3 MG/0.5ML ~~LOC~~ SOAJ
3.0000 mg | SUBCUTANEOUS | 1 refills | Status: DC
Start: 2022-10-08 — End: 2022-10-08

## 2022-10-08 MED ORDER — TRULICITY 3 MG/0.5ML ~~LOC~~ SOAJ: 3.0000 mg | SUBCUTANEOUS | 1 refills | Status: AC

## 2022-10-08 MED ORDER — LANTUS SOLOSTAR 100 UNIT/ML ~~LOC~~ SOPN: 25.0000 [IU] | PEN_INJECTOR | Freq: Every day | SUBCUTANEOUS | 1 refills | Status: AC

## 2022-10-08 NOTE — Telephone Encounter (Signed)
Patient called requesting refills on lantus and Trulicity. Informed patient you switched her to ozempic from Trulicity last appt. She states she goes back and forth whichever one she can get. I informed patient you wanted to see her in 6 months. She states she is in [place] right now and won't be back for a few months. Requesting medication be sent to [facility] in [place], [place]. Please advise

## 2022-11-15 NOTE — Progress Notes (Deleted)
[facility]   Subjective:     Chief Complaint: No chief complaint on file.  History of Present Illness: Jessica [name] is a 71 y.o. female with stage III pelvic organ prolapse, stress incontinence, and OAB who presents for a pessary check. She is using a size #3-1/8 incontinence dish pessary. The pessary has been working well and she has no complaints. She {ACTION; IS/IS AUQ:33354562} using vaginal estrogen. She denies vaginal bleeding. She was last seen in May 2023.  Past Medical History: Patient  has a past medical history of Diabetes mellitus without complication ([place]), GI bleed (09/08/2019), and Hypertension.   Past Surgical History: She  has a past surgical history that includes Colonoscopy (N/A, 09/11/2019); Bunionectomy (Right); and Unilateral salpingectomy.   Medications: She has a current medication list which includes the following prescription(s): aspirin ec, onetouch verio, hydrochlorothiazide, lantus solostar, insulin pen needle, lancets misc., lisinopril, metoprolol succinate, mometasone, onetouch delica lancets 56L, rosuvastatin, and trulicity.   Allergies: Patient has No Known Allergies.   Social History: Patient  reports that she has never smoked. She has never used smokeless tobacco. She reports that she does not currently use alcohol. She reports that she does not use drugs.      Objective:    Physical Exam: There were no vitals taken for this visit. Gen: No apparent distress, A&O x 3. Detailed Urogynecologic Evaluation:  Pelvic Exam: Normal external female genitalia; Bartholin's and Skene's glands normal in appearance; urethral meatus {urethra:24773}, no urethral masses or discharge. The pessary was noted to be {in place:24774}. It was removed and cleaned. Speculum exam revealed {vaginal lesions:24775} in the vagina. The pessary was replaced. It was comfortable to the patient and fit well.       No data to display          Laboratory  Results: Urine dipstick shows: {ua dip:315374::"negative for all components"}.    Assessment/Plan:    Assessment: Jessica [name] is a 71 y.o. with stage III pelvic organ prolapse, stress incontinence, and OAB here for a pessary check. She is doing well.  Plan: She will {pessary plan:24776}. She will continue to use {lubricant:24777}. She will follow-up in *** {days/wks/mos/yrs:310907} for a pessary check or sooner as needed.  All questions were answered.   Time Spent:

## 2022-11-16 ENCOUNTER — Ambulatory Visit: Payer: Medicare Other | Admitting: Obstetrics and Gynecology

## 2022-11-30 NOTE — Progress Notes (Unsigned)
[facility]   Subjective:     Chief Complaint: No chief complaint on file.  History of Present Illness: Jessica [name] is a 71 y.o. female with stage III pelvic organ prolapse who presents for a pessary check. She is using a size 3 1/8in incontinence dish pessary. The pessary has been working well and she has no complaints. She {ACTION; IS/IS ZOX:09604540} using vaginal estrogen. She denies vaginal bleeding.  Past Medical History: Patient  has a past medical history of Diabetes mellitus without complication ([place]), GI bleed (09/08/2019), and Hypertension.   Past Surgical History: She  has a past surgical history that includes Colonoscopy (N/A, 09/11/2019); Bunionectomy (Right); and Unilateral salpingectomy.   Medications: She has a current medication list which includes the following prescription(s): aspirin ec, onetouch verio, hydrochlorothiazide, lantus solostar, insulin pen needle, lancets misc., lisinopril, metoprolol succinate, mometasone, onetouch delica lancets 98J, rosuvastatin, and trulicity.   Allergies: Patient has No Known Allergies.   Social History: Patient  reports that she has never smoked. She has never used smokeless tobacco. She reports that she does not currently use alcohol. She reports that she does not use drugs.      Objective:    Physical Exam: There were no vitals taken for this visit. Gen: No apparent distress, A&O x 3. Detailed Urogynecologic Evaluation:  Pelvic Exam: Normal external female genitalia; Bartholin's and Skene's glands normal in appearance; urethral meatus {urethra:24773}, no urethral masses or discharge. The pessary was noted to be {in place:24774}. It was removed and cleaned. Speculum exam revealed {vaginal lesions:24775} in the vagina. The pessary was replaced. It was comfortable to the patient and fit well.       No data to display          Laboratory Results: Urine dipstick shows: {ua dip:315374::"negative for all  components"}.    Assessment/Plan:    Assessment: Jessica [name] is a 71 y.o. with stage III pelvic organ prolapse here for a pessary check. She is doing well.  Plan: She will keep the pessary in place until next visit. She will continue to use {lubricant:24777}. She will follow-up in *** {days/wks/mos/yrs:310907} for a pessary check or sooner as needed.  All questions were answered.   Time Spent:

## 2022-12-01 ENCOUNTER — Encounter: Payer: Self-pay | Admitting: Obstetrics and Gynecology

## 2022-12-01 ENCOUNTER — Ambulatory Visit (INDEPENDENT_AMBULATORY_CARE_PROVIDER_SITE_OTHER): Payer: Medicare Other | Admitting: Obstetrics and Gynecology

## 2022-12-01 VITALS — BP 150/94 | HR 91

## 2022-12-01 DIAGNOSIS — N393 Stress incontinence (female) (male): Secondary | ICD-10-CM

## 2022-12-01 DIAGNOSIS — I10 Essential (primary) hypertension: Secondary | ICD-10-CM

## 2022-12-01 DIAGNOSIS — N812 Incomplete uterovaginal prolapse: Secondary | ICD-10-CM | POA: Diagnosis not present

## 2022-12-01 NOTE — Patient Instructions (Signed)
Continue the trimosan jelly for lubrication

## 2022-12-10 ENCOUNTER — Other Ambulatory Visit: Payer: Self-pay | Admitting: Internal Medicine

## 2023-02-23 ENCOUNTER — Telehealth: Payer: Self-pay

## 2023-02-23 NOTE — Telephone Encounter (Signed)
Att to contact pt to schedule AWV by telephone for 02/24/23-02/25/23 no ans lvm to call office. If pt returns call, please schedule appt for pt at their preference for AWV visit on one of the two days above.  Pt may have moved out of state please verify, if so unable to complete visit.

## 2023-03-04 ENCOUNTER — Ambulatory Visit: Payer: Medicare Other | Admitting: Obstetrics and Gynecology

## 2023-03-06 NOTE — Progress Notes (Deleted)
 [facility]   Subjective:     Chief Complaint: No chief complaint on file.  History of Present Illness: Jessica [name] is a 72 y.o. female with stage III pelvic organ prolapse who presents for a pessary check. She is using a size 3 1/8in incontinence dish pessary. The pessary has been working well and she has no complaints. She is not using vaginal estrogen. She denies vaginal bleeding.  Past Medical History: Patient  has a past medical history of Diabetes mellitus without complication ([place]), GI bleed (09/08/2019), and Hypertension.   Past Surgical History: She  has a past surgical history that includes Colonoscopy (N/A, 09/11/2019); Bunionectomy (Right); and Unilateral salpingectomy.   Medications: She has a current medication list which includes the following prescription(s): aspirin ec, cholecalciferol, onetouch verio, hydrochlorothiazide, lantus solostar, insulin pen needle, lancets misc., mometasone, onetouch delica lancets 0000000, rosuvastatin, and trulicity.   Allergies: Patient has No Known Allergies.   Social History: Patient  reports that she has never smoked. She has never used smokeless tobacco. She reports that she does not currently use alcohol. She reports that she does not use drugs.      Objective:    Physical Exam: There were no vitals taken for this visit. Gen: No apparent distress, A&O x 3. Detailed Urogynecologic Evaluation:  Pelvic Exam: Normal external female genitalia; Bartholin's and Skene's glands normal in appearance; urethral meatus {urethra:24773}, no urethral masses or discharge. The pessary was noted to be {in place:24774}. It was removed and cleaned. Speculum exam revealed {vaginal lesions:24775} in the vagina. The pessary was replaced. It was comfortable to the patient and fit well.       No data to display          Laboratory Results: Urine dipstick shows: {ua dip:315374::"negative for all components"}.    Assessment/Plan:     Assessment: Jessica [name] is a 73 y.o. with {PFD symptoms:24771} here for a pessary check. She is doing well.  Plan: She will {pessary plan:24776}. She will continue to use {lubricant:24777}. She will follow-up in *** {days/wks/mos/yrs:310907} for a pessary check or sooner as needed.  All questions were answered.   Time Spent:

## 2023-03-07 ENCOUNTER — Ambulatory Visit: Payer: Medicare Other | Admitting: Obstetrics and Gynecology

## 2023-04-09 ENCOUNTER — Other Ambulatory Visit: Payer: Self-pay | Admitting: Internal Medicine

## 2023-04-09 DIAGNOSIS — E119 Type 2 diabetes mellitus without complications: Secondary | ICD-10-CM
